# Patient Record
Sex: Male | Born: 2005 | Race: Black or African American | Hispanic: No | Marital: Single | Smoking: Never smoker
Health system: Southern US, Community
[De-identification: ages and names within clinical notes are randomized; demographics above are authoritative.]

## PROBLEM LIST (undated history)

## (undated) DIAGNOSIS — R6250 Unspecified lack of expected normal physiological development in childhood: Secondary | ICD-10-CM

## (undated) DIAGNOSIS — Z8709 Personal history of other diseases of the respiratory system: Secondary | ICD-10-CM

## (undated) DIAGNOSIS — Z9109 Other allergy status, other than to drugs and biological substances: Secondary | ICD-10-CM

## (undated) DIAGNOSIS — H4900 Third [oculomotor] nerve palsy, unspecified eye: Secondary | ICD-10-CM

## (undated) DIAGNOSIS — R29891 Ocular torticollis: Secondary | ICD-10-CM

## (undated) DIAGNOSIS — Z8701 Personal history of pneumonia (recurrent): Secondary | ICD-10-CM

## (undated) DIAGNOSIS — H5021 Vertical strabismus, right eye: Secondary | ICD-10-CM

## (undated) DIAGNOSIS — H50612 Brown's sheath syndrome, left eye: Secondary | ICD-10-CM

## (undated) DIAGNOSIS — H9190 Unspecified hearing loss, unspecified ear: Secondary | ICD-10-CM

## (undated) DIAGNOSIS — Q753 Macrocephaly: Secondary | ICD-10-CM

## (undated) DIAGNOSIS — Z9229 Personal history of other drug therapy: Secondary | ICD-10-CM

## (undated) HISTORY — PX: OTHER SURGICAL HISTORY: SHX169

---
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
## 2023-02-28 ENCOUNTER — Ambulatory Visit (HOSPITAL_COMMUNITY)
Admission: EM | Admit: 2023-02-28 | Discharge: 2023-03-02 | Disposition: A | Discharge status: Home / Self Care | Payer: Medicaid Other | Attending: Psychiatry | Admitting: Psychiatry

## 2023-02-28 DIAGNOSIS — R45851 Suicidal ideations: Secondary | ICD-10-CM | POA: Insufficient documentation

## 2023-02-28 DIAGNOSIS — R4589 Other symptoms and signs involving emotional state: Secondary | ICD-10-CM

## 2023-02-28 DIAGNOSIS — F329 Major depressive disorder, single episode, unspecified: Secondary | ICD-10-CM | POA: Insufficient documentation

## 2023-02-28 DIAGNOSIS — F411 Generalized anxiety disorder: Secondary | ICD-10-CM | POA: Insufficient documentation

## 2023-02-28 DIAGNOSIS — Z1152 Encounter for screening for COVID-19: Secondary | ICD-10-CM | POA: Insufficient documentation

## 2023-02-28 MED ORDER — OLANZAPINE 5 MG PO TBDP: 5.0000 mg | ORAL_TABLET | Freq: Three times a day (TID) | ORAL | Status: DC | PRN

## 2023-02-28 MED ORDER — OLANZAPINE 10 MG IM SOLR: 10.0000 mg | Freq: Three times a day (TID) | INTRAMUSCULAR | Status: DC | PRN

## 2023-02-28 MED ORDER — ACETAMINOPHEN 325 MG PO TABS: 650.0000 mg | ORAL_TABLET | Freq: Four times a day (QID) | ORAL | Status: DC | PRN

## 2023-02-28 MED ORDER — OLANZAPINE 10 MG IM SOLR: 5.0000 mg | Freq: Three times a day (TID) | INTRAMUSCULAR | Status: DC | PRN

## 2023-02-28 MED ORDER — MAGNESIUM HYDROXIDE 400 MG/5ML PO SUSP: 30.0000 mL | Freq: Every day | ORAL | Status: DC | PRN

## 2023-02-28 MED ORDER — ALUM & MAG HYDROXIDE-SIMETH 200-200-20 MG/5ML PO SUSP: 30.0000 mL | ORAL | Status: DC | PRN

## 2023-02-28 NOTE — Progress Notes (Addendum)
   02/28/23 1556  [facility] Triage Screening (Walk-ins at [facility] only)  How Did You Hear About [place]? School/University  What Is the Reason for Your Visit/Call Today? [name] presents to [employer] voluntarily via [employer]. Pt states that he was bought here because he made the comment that he was going to kill himself. Pt states that he has been going through it with stress and depression. Pt currently denies SI, HI, AVH and alcohol/drug use. Pt states that he was talking with the counselor at [facility] and said a little to much which is why he is here. Pt states that the counselor said he could come back upon returning to classes.  How Long Has This Been Causing You Problems? > than 6 months  Have You Recently Had Any Thoughts About Hurting Yourself? Yes  How long ago did you have thoughts about hurting yourself? today - no plan  Are You Planning to Commit Suicide/Harm Yourself At This time? No  Have you Recently Had Thoughts About Hurting Someone [name]? No  Are You Planning To Harm Someone At This Time? No  Physical Abuse Denies  Verbal Abuse Denies  Sexual Abuse Denies  Exploitation of patient/patient's resources Denies  Self-Neglect Denies  Are you currently experiencing any auditory, visual or other hallucinations? No  Have You Used Any Alcohol or Drugs in the Past 24 Hours? No  Do you have any current medical co-morbidities that require immediate attention? No  Clinician description of patient physical appearance/behavior: neatly dressed, poor eye contact (looking at the floor), well-mannered  What Do You Feel Would Help You the Most Today? Treatment for Depression or other mood problem;Medication(s)  If access to [facility] was not available, would you have sought care in the Emergency Department? No  Determination of Need Routine (7 days)  Options For Referral Medication Management;Outpatient Therapy;Intensive Outpatient Therapy

## 2023-02-28 NOTE — ED Notes (Signed)
Patient observed/assessed in bed/chair resting quietly appearing in no distress and verbalizing no complaints at this time. Will continue to monitor.

## 2023-02-28 NOTE — BH Assessment (Signed)
Comprehensive Clinical Assessment (CCA) Note  02/28/2023 [name] [id number]  Disposition: Kenneth Guadeloupe, NP recommends pt to be admitted to [facility] for observation.   The patient demonstrates the following risk factors for suicide: Chronic risk factors for suicide include: Major Depressive Disorder, recurrent, severe without psychotic features and Generalized Anxiety Disorder. Acute risk factors for suicide include:  Per triage note, pt commented that he was going to kill himself . Protective factors for this patient include:  None . Considering these factors, the overall suicide risk at this point appears to be low. Patient is not appropriate for outpatient follow up.  [name] is a 18 year old male who presents voluntary and unaccompanied to [facility] ([facility]). Clinician asked the pt, "what brought you to the hospital?" Pt reports, he's been suicidal for the past 2-3 days with no plan. Pt reports, anxiety and depression are triggers of his suicidal ideations. Pt reports, he has panic attacks three times per week due to being in public alone. Pt denies previous suicide attempts. Pt denies, HI, hallucinations, self-injurious behaviors and access to weapons.   Pt denies, substance use Pt's BAL and UDS are pending. Pt denies, currently being linked to OPT resources (medication management and/or counseling.) Pt denies, previous inpatient admissions.   Pt was a poor historian during the assessment who presents quiet, awake laying on a bench in the assessment room with normal speech and eye contact. Pt's mood was anxious, depressed. Pt's affect was depressed, flat. Pt's insight, judgement are poor.  *Pt reports, he does not want his mother to know he's at the [facility].*  Chief Complaint:  Chief Complaint  Patient presents with   Stress   Depression   Visit Diagnosis: Major Depressive Disorder, recurrent, severe  without psychotic features.                              Generalized Anxiety Disorder.   CCA Screening, Triage and Referral (STR)  Patient Reported Information How did you hear about [place]? School/University  What Is the Reason for Your Visit/Call Today? [name] presents to [employer] voluntarily via [employer]. Pt states that he was bought here because he made the comment that he was going to kill himself. Pt states that he has been going through it with stress and depression. Pt currently denies SI, HI, AVH and alcohol/drug use. Pt states that he was talking with the counselor at [facility] and said a little to much which is why he is here. Pt states that the counselor said he could come back upon returning to classes.  How Long Has This Been Causing You Problems? > than 6 months  What Do You Feel Would Help You the Most Today? Treatment for Depression or other mood problem; Medication(s)   Have You Recently Had Any Thoughts About Hurting Yourself? Yes  Are You Planning to Commit Suicide/Harm Yourself At This time? No   Flowsheet Row ED from 02/28/2023 in [facility]  C-SSRS RISK CATEGORY Low Risk       Have you Recently Had Thoughts About Hurting Someone [name]? No  Are You Planning to Harm Someone at This Time? No  Explanation: None.   Have You Used Any Alcohol or Drugs in the Past 24 Hours? No  How Long Ago Did You Use Drugs or Alcohol? None.  What Did You Use and How Much? None.  Do You Currently Have a Therapist/Psychiatrist? No  Name of Therapist/Psychiatrist:    Have You Been Recently Discharged From Any Office Practice or Programs? No  Explanation of Discharge From Practice/Program: None.     CCA Screening Triage Referral Assessment Type of Contact: Face-to-Face  Telemedicine Service Delivery:   Is this Initial or Reassessment?   Date Telepsych consult ordered in [facility]:    Time Telepsych consult ordered in [facility]:    Location of  Assessment: [facility] Services  Provider Location: [facility] Assessment Services   Collateral Involvement: None.   Does Patient Have a Automotive engineer Guardian? No  Legal Guardian Contact Information: Pt is his own guardian.  Copy of Legal Guardianship Form: -- (Pt is his own guardian.)  Legal Guardian Notified of Arrival: -- (Pt is his own guardian.)  Legal Guardian Notified of Pending Discharge: -- (Pt is his own guardian.)  If Minor and Not Living with Parent(s), Who has Custody? Pt is an adult.  Is CPS involved or ever been involved? Never  Is APS involved or ever been involved? Never   Patient Determined To Be At Risk for Harm To Self or Others Based on Review of Patient Reported Information or Presenting Complaint? Yes, for Self-Harm  Method: No Plan  Availability of Means: No access or NA  Intent: Vague intent or NA  Notification Required: No need or identified person  Additional Information for Danger to Others Potential: -- (None.)  Additional Comments for Danger to Others Potential: None.  Are There Guns or Other Weapons in Your Home? No  Types of Guns/Weapons: Pt denies, access to weapons.  Are These Weapons Safely Secured?                            -- (NA)  Who Could Verify You Are Able To Have These Secured: Pt denies, access to weapons.  Do You Have any Outstanding Charges, Pending Court Dates, Parole/Probation? Pt denies, legal involvement.  Contacted To Inform of Risk of Harm To Self or Others: Other: Comment (None.)    Does Patient Present under Involuntary Commitment? No    [place] of Residence: [name]   Patient Currently Receiving the Following Services: Not Receiving Services   Determination of Need: Urgent (48 hours)   Options For Referral: Medication Management; Outpatient Therapy; Intensive Outpatient Therapy     CCA Biopsychosocial Patient Reported Schizophrenia/Schizoaffective Diagnosis in Past:  No   Strengths: Pt is in school.   Mental Health Symptoms Depression:  Difficulty Concentrating; Tearfulness; Fatigue; Hopelessness; Worthlessness; Irritability; Sleep (too much or little); Increase/decrease in appetite (Isolation.)   Duration of Depressive symptoms: Duration of Depressive Symptoms: Greater than two weeks   Mania:  None   Anxiety:   Difficulty concentrating; Fatigue; Irritability; Restlessness; Sleep; Worrying   Psychosis:  None   Duration of Psychotic symptoms:    Trauma:  None   Obsessions:  None   Compulsions:  None   Inattention:  Disorganized; Forgetful; Loses things; Poor follow-through on tasks; Does not follow instructions (not oppositional)   Hyperactivity/Impulsivity:  Fidgets with hands/feet   Oppositional/Defiant Behaviors:  Angry; None   Emotional Irregularity:  Recurrent suicidal behaviors/gestures/threats   Other Mood/Personality Symptoms:  None.    Mental Status Exam Appearance and self-care  Stature:  Average   Weight:  Average weight   Clothing:  Casual   Grooming:  Normal   Cosmetic use:  None   Posture/gait:  Normal  Motor activity:  Not Remarkable   Sensorium  Attention:  Normal   Concentration:  Normal   Orientation:  X5   Recall/memory:  Normal   Affect and Mood  Affect:  Depressed; Flat   Mood:  Anxious; Depressed   Relating  Eye contact:  Normal   Facial expression:  Depressed   Attitude toward examiner:  Guarded   Thought and Language  Speech flow: Normal   Thought content:  Appropriate to Mood and Circumstances   Preoccupation:  None   Hallucinations:  None   Organization:  Coherent   [employer] of Knowledge:  Poor   Intelligence:  Average   Abstraction:  Functional   Judgement:  Poor   Reality Testing:  Adequate   Insight:  Fair   Decision Making:  Impulsive   Social Functioning  Social Maturity:  Isolates   Social Judgement:  Heedless   Stress   Stressors:  Other (Comment) (Anxiety, depression.)   Coping Ability:  Overwhelmed   Skill Deficits:  Communication   Supports:  Family     Religion: Religion/Spirituality Are You A Religious Person?: No How Might This Affect Treatment?: None.  Leisure/Recreation: Leisure / Recreation Do You Have Hobbies?: No  Exercise/Diet: Exercise/Diet Do You Exercise?: No Have You Gained or Lost A Significant Amount of Weight in the Past Six Months?: No Do You Follow a Special Diet?: No Do You Have Any Trouble Sleeping?: Yes Explanation of Sleeping Difficulties: Pt reports, he doesn't eat a lot, he doesn't feel like eating.   CCA Employment/Education Employment/Work Situation: Employment / Work Situation Employment Situation: Surveyor, minerals Job has Been Impacted by Current Illness: No Has Patient ever Been in the [employer]?: No  Education: Education Is Patient Currently Attending School?: Yes School Currently Attending: Pt attends Nutritional therapist. Last Grade Completed: 12 Did You Attend College?: Yes What Type of College Degree Do you Have?: [employer], getting an Associates in Art. Did You Have An Individualized Education Program (IIEP): No Did You Have Any Difficulty At School?:  (Focusing.) Patient's Education Has Been Impacted by Current Illness: No   CCA Family/Childhood History Family and Relationship History: Family history Marital status: Single Does patient have children?: No  Childhood History:  Childhood History By whom was/is the patient raised?: Mother, Grandparents Did patient suffer any verbal/emotional/physical/sexual abuse as a child?: No Did patient suffer from severe childhood neglect?: No Has patient ever been sexually abused/assaulted/raped as an adolescent or adult?: No Was the patient ever a victim of a crime or a disaster?: No Witnessed domestic violence?: No Has patient been affected by domestic violence as an adult?: No   CCA Substance  Use Alcohol/Drug Use: Alcohol / Drug Use Pain Medications: See MAR Prescriptions: See MAR Over the Counter: See MAR History of alcohol / drug use?: No history of alcohol / drug abuse Longest period of sobriety (when/how long): None. Negative Consequences of Use:  (None.) Withdrawal Symptoms: None    ASAM's:  Six Dimensions of Multidimensional Assessment  Dimension 1:  Acute Intoxication and/or Withdrawal Potential:      Dimension 2:  Biomedical Conditions and Complications:      Dimension 3:  Emotional, Behavioral, or Cognitive Conditions and Complications:     Dimension 4:  Readiness to Change:     Dimension 5:  Relapse, Continued use, or Continued Problem Potential:     Dimension 6:  Recovery/Living Environment:     ASAM Severity Score:    ASAM Recommended Level of Treatment:  Substance use Disorder (SUD)    Recommendations for Services/Supports/Treatments: Recommendations for Services/Supports/Treatments Recommendations For Services/Supports/Treatments: Other (Comment) (Pt to be admitted to [facility] for observation.)  Disposition Recommendation per psychiatric provider: Pt to be admitted to [facility] Care for observation.    DSM5 Diagnoses: Patient Active Problem List   Diagnosis Date Noted   Strain of flexor muscle of left hip 10/09/2018     Referrals to Alternative Service(s): Referred to Alternative Service(s):   Place:   Date:   Time:    Referred to Alternative Service(s):   Place:   Date:   Time:    Referred to Alternative Service(s):   Place:   Date:   Time:    Referred to Alternative Service(s):   Place:   Date:   Time:     Redmond Pulling, [facility] Comprehensive Clinical Assessment (CCA) Screening, Triage and Referral Note  02/28/2023 Gloria Lambertson [id number]  Chief Complaint:  Chief Complaint  Patient presents with   Stress   Depression   Visit Diagnosis:   Patient Reported  Information How did you hear about [place]? School/University  What Is the Reason for Your Visit/Call Today? Duquan Gillooly presents to [employer] voluntarily via [employer]. Pt states that he was bought here because he made the comment that he was going to kill himself. Pt states that he has been going through it with stress and depression. Pt currently denies SI, HI, AVH and alcohol/drug use. Pt states that he was talking with the counselor at [facility] and said a little to much which is why he is here. Pt states that the counselor said he could come back upon returning to classes.  How Long Has This Been Causing You Problems? > than 6 months  What Do You Feel Would Help You the Most Today? Treatment for Depression or other mood problem; Medication(s)   Have You Recently Had Any Thoughts About Hurting Yourself? Yes  Are You Planning to Commit Suicide/Harm Yourself At This time? No   Have you Recently Had Thoughts About Hurting Someone [name]? No  Are You Planning to Harm Someone at This Time? No  Explanation: None.   Have You Used Any Alcohol or Drugs in the Past 24 Hours? No  How Long Ago Did You Use Drugs or Alcohol? None.  What Did You Use and How Much? None.   Do You Currently Have a Therapist/Psychiatrist? No  Name of Therapist/Psychiatrist: None.   Have You Been Recently Discharged From Any Office Practice or Programs? No  Explanation of Discharge From Practice/Program: None.    CCA Screening Triage Referral Assessment Type of Contact: Face-to-Face  Telemedicine Service Delivery:   Is this Initial or Reassessment?   Date Telepsych consult ordered in [facility]:    Time Telepsych consult ordered in [facility]:    Location of Assessment: [facility]  Provider Location: [facility] Services    Collateral Involvement: None.   Does Patient Have a Automotive engineer Guardian? No. Name and Contact of Legal Guardian: Pt is his own guardian.  If Minor and Not Living with  Parent(s), Who has Custody? Pt is an adult.  Is CPS involved or ever been involved? Never  Is APS involved or ever been involved? Never   Patient Determined To Be At Risk for Harm To Self or Others Based on Review of Patient Reported Information or Presenting Complaint? Yes, for Self-Harm  Method: No Plan  Availability of Means:  No access or NA  Intent: Vague intent or NA  Notification Required: No need or identified person  Additional Information for Danger to Others Potential: -- (None.)  Additional Comments for Danger to Others Potential: None.  Are There Guns or Other Weapons in Your Home? No  Types of Guns/Weapons: Pt denies, access to weapons.  Are These Weapons Safely Secured?                            -- (NA)  Who Could Verify You Are Able To Have These Secured: Pt denies, access to weapons.  Do You Have any Outstanding Charges, Pending Court Dates, Parole/Probation? Pt denies, legal involvement.  Contacted To Inform of Risk of Harm To Self or Others: Other: Comment (None.)   Does Patient Present under Involuntary Commitment? No    [place] of Residence: [name]   Patient Currently Receiving the Following Services: Not Receiving Services   Determination of Need: Urgent (48 hours)   Options For Referral: Medication Management; Outpatient Therapy; Intensive Outpatient Therapy   Disposition Recommendation per psychiatric provider: Pt to be admitted to [facility] for observation.   Redmond Pulling, [facility]   Redmond Pulling, MS, [facility], [facility] Triage Specialist [phone]

## 2023-02-28 NOTE — ED Provider Notes (Signed)
 [facility] Continuous Assessment Admission H&P  Date: 03/01/23 Patient Name: [name] MRN: [id number] Chief Complaint: increase anxiety,  and suicidal thoughts   Diagnoses:  Final diagnoses:  Suicidal thoughts  Anxiety state  Difficulty coping    HPI: [name], 18 y/o male with a ADHD, increased anxiety.  Presented to [facility] voluntarily.  Patient was referred by his counselor at [facility], according to the patient he was having a conversation with his counselor and he had told the counselor that he was suicidal, according to the patient he was going to kill himself 2 weeks ago when he was passing by the bridge he had planned to jump but then he kept walking.  Patient stated that he has very bad anxiety and stress.  According to the patient he lives at home with his mom and 2 sisters.  Patient is currently in college.  Patient stated that he was taking medicine when he was in the ninth grade for ADHD but since then he has not taken any more.  Patient reports he is currently not seeing a psychiatrist or therapist at this time. Patient denies access to guns at this time.  A face-to-face evaluation of patient, patient is alert and oriented x 4, speech is clear, maintain eye contact.  Patient does appear to be very anxious and depressed.  Patient affect is flat congruent with mood.  Patient however answer questions appropriately.  Patient stated he had suicidal thoughts but stated he did not have been currently, denies HI, denies AVH or paranoia.  Patient denies alcohol use denies illicit drug use at this time.  Patient denies access to guns denies wanting to hurt himself currently.  Writer discussed with patient that given his circumstances and prior thoughts of wanting to hurt himself we are recommending overnight observation and then reassessment.  Patient in agreement with plan.  Patient was given the opportunity to ask questions which were answered.  Patient also voiced concern that he did  not want his mom to find out.  Recommend observation.  Total Time spent with patient: 20 minutes  Musculoskeletal  Strength & Muscle Tone: within normal limits Gait & Station: normal Patient leans: N/A  Psychiatric Specialty Exam  Presentation General Appearance:  Casual  Eye Contact: Good  Speech: Clear and Coherent  Speech Volume: Normal  Handedness: Right   Mood and Affect  Mood: Anxious  Affect: Appropriate   Thought Process  Thought Processes: Coherent  Descriptions of Associations:Intact  Orientation:Full (Time, Place and Person)  Thought Content:Logical    Hallucinations:Hallucinations: None  Ideas of Reference:None  Suicidal Thoughts:Suicidal Thoughts: Yes, Passive SI Passive Intent and/or Plan: Without Plan; With Intent  Homicidal Thoughts:Homicidal Thoughts: No   Sensorium  Memory: Immediate Fair  Judgment: Fair  Insight: Fair   Executive Functions  Concentration: Good  Attention Span: Good  Recall: Good  Fund of Knowledge: Good  Language: Good   Psychomotor Activity  Psychomotor Activity: Psychomotor Activity: Normal   Assets  Assets: Desire for Improvement; Resilience   Sleep  Sleep: Sleep: Fair Number of Hours of Sleep: 6   Nutritional Assessment (For OBS and FBC admissions only) Has the patient had a weight loss or gain of 10 pounds or more in the last 3 months?: No Has the patient had a decrease in food intake/or appetite?: No Does the patient have dental problems?: No Does the patient have eating habits or behaviors that may be indicators of an eating disorder including binging or inducing vomiting?: No Has  the patient recently lost weight without trying?: 0 Has the patient been eating poorly because of a decreased appetite?: 0 Malnutrition Screening Tool Score: 0    Physical Exam HENT:     Head: Normocephalic.     Nose: Nose normal.  Eyes:     Pupils: Pupils are equal, round, and  reactive to light.  Cardiovascular:     Rate and Rhythm: Normal rate.  Pulmonary:     Effort: Pulmonary effort is normal.  Musculoskeletal:        General: Normal range of motion.     Cervical back: Normal range of motion.  Neurological:     General: No focal deficit present.     Mental Status: He is alert.  Psychiatric:        Mood and Affect: Mood normal.        Behavior: Behavior normal.        Thought Content: Thought content normal.        Judgment: Judgment normal.    Review of Systems  Constitutional: Negative.   HENT: Negative.    Eyes: Negative.   Respiratory: Negative.    Cardiovascular: Negative.   Gastrointestinal: Negative.   Genitourinary: Negative.   Musculoskeletal: Negative.   Skin: Negative.   Neurological: Negative.   Psychiatric/Behavioral:  Positive for depression and suicidal ideas. The patient is nervous/anxious.     Blood pressure 115/74, pulse 60, temperature 98.7 F (37.1 C), temperature source Oral, resp. rate 16, SpO2 100%. There is no height or weight on file to calculate BMI.  Past Psychiatric History: ADHD, anxiety.  Is the patient at risk to self? Yes  Has the patient been a risk to self in the past 6 months? Yes .    Has the patient been a risk to self within the distant past? No   Is the patient a risk to others? No   Has the patient been a risk to others in the past 6 months? No   Has the patient been a risk to others within the distant past? No   Past Medical History: See chart  Family History: Unknown  Social History: Denies patient denies alcohol use denies illicit drug use  Last Labs:  No visits with results within 6 Month(s) from this visit.  Latest known visit with results is:  Admission on 09/16/2015, Discharged on 09/16/2015  Component Date Value Ref Range Status   Hemoglobin 09/16/2015 11.9  11.0 - 14.6 g/dL Final    Allergies: Perennial rye grass pollen [timothy grass pollen allergen]  Medications:  Facility  Ordered Medications  Medication   acetaminophen (TYLENOL) tablet 650 mg   alum & mag hydroxide-simeth (MAALOX/MYLANTA) 200-200-20 MG/5ML suspension 30 mL   magnesium hydroxide (MILK OF MAGNESIA) suspension 30 mL   OLANZapine zydis (ZYPREXA) disintegrating tablet 5 mg   OLANZapine (ZYPREXA) injection 5 mg   OLANZapine (ZYPREXA) injection 10 mg   PTA Medications  Medication Sig   cetirizine HCl (ZYRTEC) 5 MG/5ML SOLN Take 5 mLs (5 mg total) by mouth daily.      Medical Decision Making  Observation   Meds ordered this encounter  Medications   acetaminophen (TYLENOL) tablet 650 mg   alum & mag hydroxide-simeth (MAALOX/MYLANTA) 200-200-20 MG/5ML suspension 30 mL   magnesium hydroxide (MILK OF MAGNESIA) suspension 30 mL   OLANZapine zydis (ZYPREXA) disintegrating tablet 5 mg   OLANZapine (ZYPREXA) injection 5 mg   OLANZapine (ZYPREXA) injection 10 mg    Lab Orders  SARS Coronavirus 2 by RT PCR (hospital order, performed in [facility] hospital lab) *cepheid single result test* Anterior Nasal Swab         CBC with Differential/Platelet         Comprehensive metabolic panel         Ethanol         TSH         POCT Urine Drug Screen - (I-Screen)      Recommendations  Based on my evaluation the patient does not appear to have an emergency medical condition.  [name], NP 03/01/23  4:06 AM   Isa Rankin, MD 03/21/23 [phone]

## 2023-03-01 NOTE — ED Notes (Signed)
Pt sleeping@this  time breathing even and unlabored will continue to monitor for safety

## 2023-03-01 NOTE — ED Provider Notes (Signed)
Behavioral Health Progress Note  Date and Time: 03/01/2023 5:17 PM Name: [name] MRN:  [id number]  Subjective:  [name] is a 18 y/o male with a ADHD, increased anxiety.  Presented to [facility] voluntarily.  Patient was referred by his counselor at [facility], according to the patient he was having a conversation with his counselor and he had told the counselor that he was suicidal, according to the patient he was going to kill himself 2 weeks ago when he was passing by the bridge he had planned to jump but then he kept walking.  Patient stated that he has very bad anxiety and stress.   Today's assessment notes: Patient is seen and examined on the unit sitting up in a chair.  He is alert, calm, cooperative and oriented to person, place, time, and situation.  Chart reviewed and findings shared with the treatment team and consult with attending psychiatrist.  Speech is clear, coherent, with normal volume and pattern.  Able to maintain good eye contact with this provider.  Objectively not responding to internal or external stimuli.  Denies delusional thinking, paranoia or ideas of reference.  He further denies SI, HI or AVH.  Patient reported anxiety as #2/10, depression of #3/10, with 10 being high severity.  Reports appetite is good and he slept over 8 hours last night.  Patient requesting to be discharged, and refusing to share his mom's phone number with this provider.  Made patient aware that this provider needs collateral from mom especially as he lives with his mother prior to his disposition.  Patient finally provided mom's phone number and attempted to call mom x 3 and unable to reach at this time.  Vital signs reviewed within normal limits.  Addendum: Patient's mother finally reached at 5 PM.  She reports that she is going out of town and we may call patient's godmother Behr Cislo at [phone] to keep pick up patient when he is ready to be discharged.  Final diagnoses:  Suicidal  thoughts  Anxiety state  Difficulty coping   Total Time spent with patient: 45 minutes  Past Psychiatric History: See H&P Past Medical History: See H&P Family History: See H&P Family Psychiatric  History: See H&P Social History: See H&P  Additional Social History:    Pain Medications: See MAR Prescriptions: See MAR Over the Counter: See MAR History of alcohol / drug use?: No history of alcohol / drug abuse Longest period of sobriety (when/how long): None. Negative Consequences of Use:  (None.) Withdrawal Symptoms: None   Sleep: Good  Appetite:  Good  Current Medications:  Current Facility-Administered Medications  Medication Dose Route Frequency Provider Last Rate Last Admin   acetaminophen (TYLENOL) tablet 650 mg  650 mg Oral Q6H PRN [name], NP       alum & mag hydroxide-simeth (MAALOX/MYLANTA) 200-200-20 MG/5ML suspension 30 mL  30 mL Oral Q4H PRN [name], NP       magnesium hydroxide (MILK OF MAGNESIA) suspension 30 mL  30 mL Oral Daily PRN [name], NP       OLANZapine (ZYPREXA) injection 10 mg  10 mg Intramuscular TID PRN [name], NP       OLANZapine (ZYPREXA) injection 5 mg  5 mg Intramuscular TID PRN [name], NP       OLANZapine zydis (ZYPREXA) disintegrating tablet 5 mg  5 mg Oral TID PRN [name], NP       No current outpatient medications on file.   Labs  Lab Results:  No visits with results within 6 Month(s) from this visit.  Latest known visit with results is:  Admission on 09/16/2015, Discharged on 09/16/2015  Component Date Value Ref Range Status   Hemoglobin 09/16/2015 11.9  11.0 - 14.6 g/dL Final   Blood Alcohol level:  No results found for: "ETH"  Metabolic Disorder Labs: No results found for: "HGBA1C", "MPG" No results found for: "PROLACTIN" No results found for: "CHOL", "TRIG", "HDL", "CHOLHDL", "VLDL", "LDLCALC"  Therapeutic Lab Levels: No results found for: "LITHIUM" No results found for: "VALPROATE" No  results found for: "CBMZ"  Physical Findings   Flowsheet Row ED from 02/28/2023 in [facility]  C-SSRS RISK CATEGORY Low Risk      Musculoskeletal  Strength & Muscle Tone: within normal limits Gait & Station: normal Patient leans: N/A  Psychiatric Specialty Exam  Presentation  General Appearance:  Appropriate for Environment; Casual; Fairly Groomed  Eye Contact: Fair  Speech: Clear and Coherent  Speech Volume: Normal  Handedness: Right  Mood and Affect  Mood: Anxious; Depressed  Affect: Appropriate; Congruent  Thought Process  Thought Processes: Coherent; Linear  Descriptions of Associations:Intact  Orientation:Full (Time, Place and Person)  Thought Content:Logical  Diagnosis of Schizophrenia or Schizoaffective disorder in past: No    Hallucinations:Hallucinations: None  Ideas of Reference:None  Suicidal Thoughts:Suicidal Thoughts: No SI Passive Intent and/or Plan: -- (Denies)  Homicidal Thoughts:Homicidal Thoughts: No  Sensorium  Memory: Immediate Fair; Recent Fair  Judgment: Fair  Insight: Fair  Art therapist  Concentration: Fair  Attention Span: Fair  Recall: Fair  Fund of Knowledge: Fair  Language: Good  Psychomotor Activity  Psychomotor Activity: Psychomotor Activity: Normal  Assets  Assets: Communication Skills; Physical Health  Sleep  Sleep: Sleep: Fair Number of Hours of Sleep: 8  Physical Exam  Physical Exam Vitals and nursing note reviewed.  HENT:     Head: Normocephalic.     Nose: Nose normal.     Mouth/Throat:     Mouth: Mucous membranes are moist.     Pharynx: Oropharynx is clear.  Eyes:     Extraocular Movements: Extraocular movements intact.  Cardiovascular:     Rate and Rhythm: Normal rate.     Pulses: Normal pulses.  Pulmonary:     Effort: Pulmonary effort is normal.  Abdominal:     Comments: Deferred   Genitourinary:    Penis: Normal.      Comments:  Deferred Musculoskeletal:        General: Normal range of motion.     Cervical back: Normal range of motion.  Skin:    General: Skin is warm.  Neurological:     General: No focal deficit present.     Mental Status: He is alert and oriented to person, place, and time.  Psychiatric:        Mood and Affect: Mood normal.        Behavior: Behavior normal.        Thought Content: Thought content normal.    Review of Systems  Constitutional:  Negative for chills and fever.  HENT:  Negative for hearing loss and sore throat.   Eyes:  Negative for blurred vision.  Respiratory:  Negative for cough, sputum production, shortness of breath and wheezing.   Cardiovascular:  Negative for chest pain and palpitations.  Gastrointestinal:  Negative for abdominal pain, constipation, diarrhea, heartburn, nausea and vomiting.  Genitourinary:  Negative for dysuria, frequency and urgency.  Musculoskeletal: Negative.   Skin:  Negative for itching and rash.  Neurological:  Negative for dizziness, tingling and headaches.  Endo/Heme/Allergies:        See allergy listing  Psychiatric/Behavioral:  Positive for depression. Negative for hallucinations, substance abuse and suicidal ideas. The patient is nervous/anxious. The patient does not have insomnia.    Blood pressure 112/88, pulse 93, temperature 98.3 F (36.8 C), temperature source Oral, resp. rate 20, SpO2 100%. There is no height or weight on file to calculate BMI.  Treatment Plan Summary: Daily contact with patient to assess and evaluate symptoms and progress in treatment and Medication management  Cecilie Lowers, FNP 03/01/2023 5:17 PM

## 2023-03-01 NOTE — ED Notes (Signed)
Patient A&O x 3 calm and cooperative with a logical thought process and clear coherent speech. Patient denies SI, HI, AVH. Patient does not appear to be responsive to internal stimuli. Patient verbally contracted for safety.

## 2023-03-01 NOTE — ED Notes (Signed)
Pt is anxious due to him wanting to leave the facility he states he feels he is not being helped by just laying here it was explained to him that due to him feeling SI with plan to jump off bridge we want to help him he is denying SI/HI/AVH at this time he say he is not depressed anymore and he just wants to go home I will let the NP know of  pt request he denies any pain or distress he is alert and orient x 4 will continue to monitor for safety

## 2023-03-01 NOTE — ED Notes (Signed)
Meeting with the provider

## 2023-03-02 LAB — TSH: TSH: 1.708 u[IU]/mL (ref 0.350–4.500)

## 2023-03-02 LAB — CBC WITH DIFFERENTIAL/PLATELET
Abs Immature Granulocytes: 0 10*3/uL (ref 0.00–0.07)
Basophils Absolute: 0 10*3/uL (ref 0.0–0.1)
Basophils Relative: 1 %
Eosinophils Absolute: 0.1 10*3/uL (ref 0.0–0.5)
Eosinophils Relative: 4 %
HCT: 46.2 % (ref 39.0–52.0)
Hemoglobin: 15.6 g/dL (ref 13.0–17.0)
Immature Granulocytes: 0 %
Lymphocytes Relative: 42 %
Lymphs Abs: 1.5 10*3/uL (ref 0.7–4.0)
MCH: 29.1 pg (ref 26.0–34.0)
MCHC: 33.8 g/dL (ref 30.0–36.0)
MCV: 86 fL (ref 80.0–100.0)
Monocytes Absolute: 0.3 10*3/uL (ref 0.1–1.0)
Monocytes Relative: 9 %
Neutro Abs: 1.6 10*3/uL — ABNORMAL LOW (ref 1.7–7.7)
Neutrophils Relative %: 44 %
Platelets: 285 10*3/uL (ref 150–400)
RBC: 5.37 MIL/uL (ref 4.22–5.81)
RDW: 12.3 % (ref 11.5–15.5)
WBC: 3.6 10*3/uL — ABNORMAL LOW (ref 4.0–10.5)
nRBC: 0 % (ref 0.0–0.2)

## 2023-03-02 LAB — POCT URINE DRUG SCREEN - MANUAL ENTRY (I-SCREEN)
POC Amphetamine UR: NOT DETECTED
POC Buprenorphine (BUP): NOT DETECTED
POC Cocaine UR: NOT DETECTED
POC Marijuana UR: NOT DETECTED
POC Methadone UR: NOT DETECTED
POC Methamphetamine UR: NOT DETECTED
POC Morphine: NOT DETECTED
POC Oxazepam (BZO): NOT DETECTED
POC Oxycodone UR: NOT DETECTED
POC Secobarbital (BAR): NOT DETECTED

## 2023-03-02 LAB — COMPREHENSIVE METABOLIC PANEL
ALT: 11 U/L (ref 0–44)
AST: 19 U/L (ref 15–41)
Albumin: 4.5 g/dL (ref 3.5–5.0)
Alkaline Phosphatase: 99 U/L (ref 38–126)
Anion gap: 9 (ref 5–15)
BUN: 18 mg/dL (ref 6–20)
CO2: 26 mmol/L (ref 22–32)
Calcium: 9.8 mg/dL (ref 8.9–10.3)
Chloride: 105 mmol/L (ref 98–111)
Creatinine, Ser: 0.96 mg/dL (ref 0.61–1.24)
GFR, Estimated: >60 mL/min (ref >60)
Glucose, Bld: 80 mg/dL (ref 70–99)
Potassium: 4.2 mmol/L (ref 3.5–5.1)
Sodium: 140 mmol/L (ref 135–145)
Total Bilirubin: 2.1 mg/dL — ABNORMAL HIGH (ref 0.0–1.2)
Total Protein: 7.4 g/dL (ref 6.5–8.1)

## 2023-03-02 LAB — SARS CORONAVIRUS 2 BY RT PCR: SARS Coronavirus 2 by RT PCR: NEGATIVE

## 2023-03-02 LAB — ETHANOL: Alcohol, Ethyl (B): <10 mg/dL (ref <10)

## 2023-03-02 MED ORDER — FLUOXETINE HCL 10 MG PO CAPS: 10.0000 mg | ORAL_CAPSULE | Freq: Every day | ORAL | 3 refills | Status: DC

## 2023-03-02 MED ORDER — HYDROXYZINE HCL 25 MG PO TABS: 25.0000 mg | ORAL_TABLET | Freq: Three times a day (TID) | ORAL | 0 refills | Status: DC | PRN

## 2023-03-02 MED ORDER — HYDROXYZINE HCL 25 MG PO TABS: 25.0000 mg | ORAL_TABLET | Freq: Three times a day (TID) | ORAL | Status: DC | PRN

## 2023-03-02 MED ORDER — FLUOXETINE HCL 10 MG PO CAPS
10.0000 mg | ORAL_CAPSULE | Freq: Every day | ORAL | Status: DC
  Administered 2023-03-02: 10 mg via ORAL
  Filled 2023-03-02: qty 1

## 2023-03-02 NOTE — ED Notes (Signed)
Patient alert & oriented x4. Denies intent to harm self or others when asked. Denies A/VH. Patient denies any physical complaints when asked. Patient did get teary (1 single tear after which he covered majority of his face) during assessment. Patient in no visible acute distress at this time. Support and encouragement provided. Routine safety checks conducted per facility protocol. Encouraged patient to notify staff if any thoughts of harm towards self or others arise. Patient verbalizes understanding and agreement.

## 2023-03-02 NOTE — ED Notes (Signed)
Patient resting in lounger with eyes closed, respirations even and unlabored. Patient in no apparent acute distress. Environment secured. Safety checks in place per facility protocol.

## 2023-03-02 NOTE — ED Notes (Signed)
Patient resting quietly in bed with eyes closed. Respirations equal and unlabored, skin warm and dry, NAD. Routine safety checks conducted according to facility protocol. Will continue to monitor for safety.

## 2023-03-02 NOTE — ED Provider Notes (Signed)
FBC/OBS ASAP Discharge Summary  Date and Time: 03/02/2023 12:48 PM  Name: [name]  MRN:  [id number]   Discharge Diagnoses:  Final diagnoses:  Suicidal thoughts  Anxiety state  Difficulty coping  Major depressive disorder with current active episode, unspecified depression episode severity, unspecified whether recurrent   Subjective: [name] is a 18 y/o male with a ADHD, increased anxiety.  Presented to [facility] voluntarily.  Patient was referred by his counselor at [facility], according to the patient he was having a conversation with his counselor and he had told the counselor that he was suicidal, according to the patient he was going to kill himself 2 weeks ago when he was passing by the bridge he had planned to jump but then he kept walking.  Patient stated that he has very bad anxiety and stress.   Stay Summary:  Discharge Recommendations:  The patient is being discharged with his family. Patient is to take his discharge medications as ordered.  See follow up above. We recommend that he participate in individual therapy to target uncontrollable agitation and substance abuse.  We recommend that he participate in family therapy to target the conflict with his family, to improve communication skills and conflict resolution skills.  Family is to initiate/implement a contingency based behavioral model to address patient's behavior. We recommend that he get AIMS scale, height, weight, blood pressure, fasting lipid panel, fasting blood sugar in three months from discharge as he's on atypical antipsychotics.  Patient will benefit from monitoring of recurrent suicidal ideation since patient is on antidepressant medication. The patient should abstain from all illicit substances and alcohol.  If the patient's symptoms worsen or do not continue to improve or if the patient becomes actively suicidal or homicidal then it is recommended that the patient return to the closest hospital emergency room  or call 911 for further evaluation and treatment. National Suicide Prevention Lifeline 1800-SUICIDE or [phone]. Please follow up with your primary medical doctor for all other medical needs.  The patient has been educated on the possible side effects to medications and he/his guardian is to contact a medical professional and inform outpatient provider of any new side effects of medication. He s to take regular diet and activity as tolerated.  Will benefit from moderate daily exercise. Family was educated about removing/locking any firearms, medications or dangerous products from the home.  Total Time spent with patient: 45 minutes  Past Psychiatric History: Past history of ADHD, increased anxiety. Past Medical History: See H&P Family History: See H&P Family Psychiatric History: See HPI Social History: See H&P Tobacco Cessation:  N/A, patient does not currently use tobacco products  Current Medications:  Current Facility-Administered Medications  Medication Dose Route Frequency Provider Last Rate Last Admin   acetaminophen (TYLENOL) tablet 650 mg  650 mg Oral Q6H PRN [name], NP       alum & mag hydroxide-simeth (MAALOX/MYLANTA) 200-200-20 MG/5ML suspension 30 mL  30 mL Oral Q4H PRN [name], NP       magnesium hydroxide (MILK OF MAGNESIA) suspension 30 mL  30 mL Oral Daily PRN [name], NP       OLANZapine (ZYPREXA) injection 10 mg  10 mg Intramuscular TID PRN [name], NP       OLANZapine (ZYPREXA) injection 5 mg  5 mg Intramuscular TID PRN [name], NP       OLANZapine zydis (ZYPREXA) disintegrating tablet 5 mg  5 mg Oral TID PRN [name], NP  No current outpatient medications on file.   PTA Medications:  Facility Ordered Medications  Medication   acetaminophen (TYLENOL) tablet 650 mg   alum & mag hydroxide-simeth (MAALOX/MYLANTA) 200-200-20 MG/5ML suspension 30 mL   magnesium hydroxide (MILK OF MAGNESIA) suspension 30 mL   OLANZapine zydis  (ZYPREXA) disintegrating tablet 5 mg   OLANZapine (ZYPREXA) injection 5 mg   OLANZapine (ZYPREXA) injection 10 mg       No data to display         Flowsheet Row ED from [facility]/28/2023 [facility]  C-SSRS RISK CATEGORY Low Risk      Musculoskeletal  Strength & Muscle Tone: within normal limits Gait & Station: normal Patient leans: N/A  Psychiatric Specialty Exam  Presentation  General Appearance:  Appropriate for Environment; Casual  Eye Contact: Good  Speech: Clear and Coherent; Normal Rate  Speech Volume: Normal  Handedness: Right  Mood and Affect  Mood: Euthymic  Affect: Congruent  Thought Process  Thought Processes: Coherent; Linear  Descriptions of Associations:Intact  Orientation:Full (Time, Place and Person)  Thought Content:Logical  Diagnosis of Schizophrenia or Schizoaffective disorder in past: No    Hallucinations:Hallucinations: None  Ideas of Reference:None  Suicidal Thoughts:Suicidal Thoughts: No SI Passive Intent and/or Plan: -- (Denies)  Homicidal Thoughts:Homicidal Thoughts: No  Sensorium  Memory: Immediate Good; Recent Good  Judgment: Fair  Insight: Fair  Art therapist  Concentration: Good  Attention Span: Good  Recall: Good  Fund of Knowledge: Fair  Language: Good  Psychomotor Activity  Psychomotor Activity: Psychomotor Activity: Normal  Assets  Assets: Communication Skills; Desire for Improvement; Housing; Social Support  Sleep  Sleep: Sleep: Good Number of Hours of Sleep: 10   Nutritional Assessment (For OBS and FBC admissions only) Has the patient had a weight loss or gain of 10 pounds or more in the last 3 months?: No Has the patient had a decrease in food intake/or appetite?: No Does the patient have dental problems?: No Does the patient have eating habits or behaviors that may be indicators of an eating disorder including binging or inducing vomiting?:  No Has the patient recently lost weight without trying?: 0 Has the patient been eating poorly because of a decreased appetite?: 0 Malnutrition Screening Tool Score: 0    Physical Exam  Physical Exam Vitals and nursing note reviewed.  HENT:     Head: Normocephalic.     Nose: Nose normal.     Mouth/Throat:     Mouth: Mucous membranes are moist.     Pharynx: Oropharynx is clear.  Eyes:     Extraocular Movements: Extraocular movements intact.  Cardiovascular:     Rate and Rhythm: Normal rate.     Pulses: Normal pulses.  Pulmonary:     Effort: Pulmonary effort is normal.  Abdominal:     Comments: Deferred  Genitourinary:    Comments: Deferred Musculoskeletal:        General: Normal range of motion.     Cervical back: Normal range of motion.  Skin:    General: Skin is warm.  Neurological:     General: No focal deficit present.     Mental Status: He is alert and oriented to person, place, and time.  Psychiatric:        Mood and Affect: Mood normal.        Behavior: Behavior normal.        Thought Content: Thought content normal.    Review of Systems  Constitutional:  Negative for  chills and fever.  HENT:  Negative for sore throat.   Eyes:  Negative for blurred vision.  Respiratory:  Negative for cough, sputum production, shortness of breath and wheezing.   Cardiovascular:  Negative for chest pain and palpitations.  Gastrointestinal:  Negative for abdominal pain, constipation, diarrhea, heartburn, nausea and vomiting.  Genitourinary: Negative.   Musculoskeletal: Negative.   Skin:  Negative for itching and rash.  Neurological:  Negative for dizziness, tingling and headaches.  Endo/Heme/Allergies:        See allergy listing  Psychiatric/Behavioral:  Negative for depression, hallucinations, substance abuse and suicidal ideas. The patient is nervous/anxious.    Blood pressure 122/77, pulse 72, temperature 97.8 F (36.6 C), temperature source Oral, resp. rate 16, SpO2 99%.  There is no height or weight on file to calculate BMI.  Demographic Factors:  Male, Adolescent or young adult, Low socioeconomic status, and Unemployed  Loss Factors: NA  Historical Factors: NA  Risk Reduction Factors:   NA  Continued Clinical Symptoms:  Depression:   Recent sense of peace/wellbeing  Cognitive Features That Contribute To Risk:  Polarized thinking    Suicide Risk:  Mild:  Suicidal ideation of limited frequency, intensity, duration, and specificity.  There are no identifiable plans, no associated intent, mild dysphoria and related symptoms, good self-control (both objective and subjective assessment), few other risk factors, and identifiable protective factors, including available and accessible social support.  Plan Of Care/Follow-up recommendations:   Activity: as tolerated  Diet: heart healthy  Other: -Follow-up with your outpatient psychiatric provider -instructions on appointment date, time, and address (location) are provided to you in discharge paperwork.  -Take your psychiatric medications as prescribed at discharge - instructions are provided to you in the discharge paperwork  -Follow-up with outpatient primary care doctor and other specialists -for management of preventative medicine and chronic medical disease: None  -Testing: Follow-up with outpatient provider for abnormal lab results: None  -If you are prescribed an atypical antipsychotic medication, we recommend that your outpatient psychiatrist follow routine screening for side effects within 3 months of discharge, including monitoring: AIMS scale, height, weight, blood pressure, fasting lipid panel, HbA1c, and fasting blood sugar.   -Recommend total abstinence from alcohol, tobacco, and other illicit drug use at discharge.   -If your psychiatric symptoms recur, worsen, or if you have side effects to your psychiatric medications, call your outpatient psychiatric provider, 911, [phone] or go to the  nearest emergency department.  -If suicidal thoughts occur, immediately call your outpatient psychiatric provider, 911, [phone] or go to the nearest emergency department.  Disposition: Discharged to home  Cecilie Lowers, FNP 03/02/2023, 12:48 PM

## 2023-03-02 NOTE — ED Notes (Signed)
Patient resting in lounger. Calm, collected, no physical complaints at this time. Patient in no apparent acute distress. Environment secured. Safety checks in place per facility protocol.

## 2023-03-02 NOTE — ED Notes (Signed)
Patient denied breakfast

## 2023-03-02 NOTE — ED Notes (Signed)
Pt discharged with  AVS.  AVS reviewed prior to discharge.  Pt alert, oriented, and ambulatory.  Safety maintained.

## 2023-03-07 ENCOUNTER — Encounter (HOSPITAL_COMMUNITY): Payer: Self-pay

## 2023-03-07 ENCOUNTER — Emergency Department (HOSPITAL_COMMUNITY)
Admission: EM | Admit: 2023-03-07 | Discharge: 2023-03-07 | Disposition: A | Discharge status: Home / Self Care | Payer: Medicaid Other | Attending: Emergency Medicine | Admitting: Emergency Medicine

## 2023-03-07 ENCOUNTER — Other Ambulatory Visit: Payer: Self-pay

## 2023-03-07 DIAGNOSIS — F32A Depression, unspecified: Secondary | ICD-10-CM | POA: Diagnosis present

## 2023-03-07 DIAGNOSIS — F419 Anxiety disorder, unspecified: Secondary | ICD-10-CM | POA: Insufficient documentation

## 2023-03-07 LAB — ACETAMINOPHEN LEVEL: Acetaminophen (Tylenol), Serum: <10 ug/mL — ABNORMAL LOW (ref 10–30)

## 2023-03-07 LAB — COMPREHENSIVE METABOLIC PANEL
ALT: 13 U/L (ref 0–44)
AST: 23 U/L (ref 15–41)
Albumin: 4.5 g/dL (ref 3.5–5.0)
Alkaline Phosphatase: 105 U/L (ref 38–126)
Anion gap: 8 (ref 5–15)
BUN: 18 mg/dL (ref 6–20)
CO2: 27 mmol/L (ref 22–32)
Calcium: 9.2 mg/dL (ref 8.9–10.3)
Chloride: 104 mmol/L (ref 98–111)
Creatinine, Ser: 0.85 mg/dL (ref 0.61–1.24)
GFR, Estimated: >60 mL/min (ref >60)
Glucose, Bld: 94 mg/dL (ref 70–99)
Potassium: 3.9 mmol/L (ref 3.5–5.1)
Sodium: 139 mmol/L (ref 135–145)
Total Bilirubin: 1.1 mg/dL (ref 0.0–1.2)
Total Protein: 7.7 g/dL (ref 6.5–8.1)

## 2023-03-07 LAB — CBC
HCT: 42.3 % (ref 39.0–52.0)
Hemoglobin: 13.8 g/dL (ref 13.0–17.0)
MCH: 28.7 pg (ref 26.0–34.0)
MCHC: 32.6 g/dL (ref 30.0–36.0)
MCV: 87.9 fL (ref 80.0–100.0)
Platelets: 322 10*3/uL (ref 150–400)
RBC: 4.81 MIL/uL (ref 4.22–5.81)
RDW: 12 % (ref 11.5–15.5)
WBC: 4.8 10*3/uL (ref 4.0–10.5)
nRBC: 0 % (ref 0.0–0.2)

## 2023-03-07 LAB — ETHANOL: Alcohol, Ethyl (B): <10 mg/dL (ref <10)

## 2023-03-07 LAB — RAPID URINE DRUG SCREEN, HOSP PERFORMED
Amphetamines: NOT DETECTED
Barbiturates: NOT DETECTED
Benzodiazepines: NOT DETECTED
Cocaine: NOT DETECTED
Opiates: NOT DETECTED
Tetrahydrocannabinol: NOT DETECTED

## 2023-03-07 LAB — SALICYLATE LEVEL: Salicylate Lvl: <7 mg/dL — ABNORMAL LOW (ref 7.0–30.0)

## 2023-03-07 MED ORDER — HYDROXYZINE HCL 25 MG PO TABS: 25.0000 mg | ORAL_TABLET | Freq: Four times a day (QID) | ORAL | 0 refills | Status: DC

## 2023-03-07 NOTE — Discharge Instructions (Signed)
Start Vistaril and it looks like you were started on Prozac.  I think that these are reasonable medications to start.  Follow-up with behavioral health.  Call behavioral health number that I provided you and ask for outpatient resources for depression.

## 2023-03-07 NOTE — ED Triage Notes (Signed)
Depression over the last few years that has been worsening this week. Denies SI or HI, but states he feels more depressed than he ever has. No hallucinations, not on medications for depression.

## 2023-03-07 NOTE — ED Provider Notes (Signed)
[place] EMERGENCY DEPARTMENT AT [facility] Provider Note   CSN: [id number] Arrival date & time: 03/07/23  1926     History  Chief Complaint  Patient presents with   Depression    Jaylan Hinojosa is a 18 y.o. male.  Patient here with depression anxiety.  Denies any suicidal homicidal thoughts.  He has been dealing with the symptoms for a long time.  Sounds like he was recently started on some medications possibly Prozac.  He denies any weakness numbness tingling.  No headache chest pain shortness of breath.  No access to weapons at his home.  He lives with his mom.  He feels safe at home.  He has a friend with him that he is been friends with for 7 years.  He does not have access any medications that can cause harm as well.  The history is provided by the patient.       Home Medications Prior to Admission medications   Medication Sig Start Date End Date Taking? Authorizing Provider  hydrOXYzine (ATARAX) 25 MG tablet Take 1 tablet (25 mg total) by mouth every 6 (six) hours. 03/07/23  Yes Raney Koeppen, DO  FLUoxetine (PROZAC) 10 MG capsule Take 1 capsule (10 mg total) by mouth daily. 03/02/23   Cecilie Lowers, FNP      Allergies    Patient has no known allergies.    Review of Systems   Review of Systems  Physical Exam Updated Vital Signs BP (!) 135/56   Pulse 64   Temp 98.5 F (36.9 C)   Resp 16   Ht 5\' 9"  (1.753 m)   Wt 54.4 kg   SpO2 100%   BMI 17.72 kg/m  Physical Exam Vitals and nursing note reviewed.  Constitutional:      General: He is not in acute distress.    Appearance: He is well-developed. He is not ill-appearing.  HENT:     Head: Normocephalic and atraumatic.     Nose: Nose normal.     Mouth/Throat:     Mouth: Mucous membranes are moist.  Eyes:     Extraocular Movements: Extraocular movements intact.     Conjunctiva/sclera: Conjunctivae normal.     Pupils: Pupils are equal, round, and reactive to light.  Cardiovascular:     Rate and  Rhythm: Normal rate and regular rhythm.     Pulses: Normal pulses.     Heart sounds: Normal heart sounds. No murmur heard. Pulmonary:     Effort: Pulmonary effort is normal. No respiratory distress.     Breath sounds: Normal breath sounds.  Abdominal:     Palpations: Abdomen is soft.     Tenderness: There is no abdominal tenderness.  Musculoskeletal:        General: No swelling.     Cervical back: Normal range of motion and neck supple.  Skin:    General: Skin is warm and dry.     Capillary Refill: Capillary refill takes less than 2 seconds.  Neurological:     General: No focal deficit present.     Mental Status: He is alert.  Psychiatric:     Comments: Patient admits to depression but he denies SI HI, he seems very well-adjusted, trustworthy     ED Results / Procedures / Treatments   Labs (all labs ordered are listed, but only abnormal results are displayed) Labs Reviewed  SALICYLATE LEVEL - Abnormal; Notable for the following components:      Result Value  Salicylate Lvl <7.0 (*)    All other components within normal limits  ACETAMINOPHEN LEVEL - Abnormal; Notable for the following components:   Acetaminophen (Tylenol), Serum <10 (*)    All other components within normal limits  COMPREHENSIVE METABOLIC PANEL  ETHANOL  CBC  RAPID URINE DRUG SCREEN, HOSP PERFORMED    EKG None  Radiology No results found.  Procedures Procedures    Medications Ordered in ED Medications - No data to display  ED Course/ Medical Decision Making/ A&P                                 Medical Decision Making Amount and/or Complexity of Data Reviewed Labs: ordered.  Risk Prescription drug management.   Deke Tilghman is here with depression.  He denies SI HI.  He had medical clearance labs ordered before my evaluation which were unremarkable.  Overall I am able to contract for safety with him.  He has no access to weapons at home.  He lives with his mom.  He feels safe at home.   He has a good friend with him that he trusts.  Overall looks like he was started on Prozac but has not had a started yet.  Looks like he was probably prescribed Vistaril as well but we will represcribe this just in case.  Overall he is trustworthy well-appearing we had extensive talk about anxiety depression.  I think he will return if he starts to feel suicidal homicidal but at this time he is just looking for some outpatient support.  I have given him some resources for this.  He was discharged in good condition.  This chart was dictated using voice recognition software.  Despite best efforts to proofread,  errors can occur which can change the documentation meaning.         Final Clinical Impression(s) / ED Diagnoses Final diagnoses:  Depression, unspecified depression type    Rx / DC Orders ED Discharge Orders          Ordered    hydrOXYzine (ATARAX) 25 MG tablet  Every 6 hours        03/07/23 2214              Virgina Norfolk, DO 03/07/23 2217

## 2023-05-03 ENCOUNTER — Inpatient Hospital Stay (HOSPITAL_COMMUNITY): Admission: AD | Admit: 2023-05-03 | Source: Intra-hospital

## 2023-05-03 ENCOUNTER — Ambulatory Visit (HOSPITAL_COMMUNITY)
Admission: EM | Admit: 2023-05-03 | Discharge: 2023-05-04 | Disposition: A | Discharge status: Other Acute Inpatient Hospital | Attending: Psychiatry | Admitting: Psychiatry

## 2023-05-03 DIAGNOSIS — F333 Major depressive disorder, recurrent, severe with psychotic symptoms: Secondary | ICD-10-CM | POA: Insufficient documentation

## 2023-05-03 LAB — HEMOGLOBIN A1C
Hgb A1c MFr Bld: 4.5 % — ABNORMAL LOW (ref 4.8–5.6)
Mean Plasma Glucose: 82.45 mg/dL

## 2023-05-03 LAB — LIPID PANEL
Cholesterol: 119 mg/dL (ref 0–200)
HDL: 51 mg/dL (ref >40)
LDL Cholesterol: 54 mg/dL (ref 0–99)
Total CHOL/HDL Ratio: 2.3 ratio
Triglycerides: 72 mg/dL (ref <150)
VLDL: 14 mg/dL (ref 0–40)

## 2023-05-03 LAB — CBC WITH DIFFERENTIAL/PLATELET
Abs Immature Granulocytes: 0.01 10*3/uL (ref 0.00–0.07)
Basophils Absolute: 0 10*3/uL (ref 0.0–0.1)
Basophils Relative: 1 %
Eosinophils Absolute: 0.2 10*3/uL (ref 0.0–0.5)
Eosinophils Relative: 5 %
HCT: 40.3 % (ref 39.0–52.0)
Hemoglobin: 13.4 g/dL (ref 13.0–17.0)
Immature Granulocytes: 0 %
Lymphocytes Relative: 44 %
Lymphs Abs: 1.9 10*3/uL (ref 0.7–4.0)
MCH: 28.6 pg (ref 26.0–34.0)
MCHC: 33.3 g/dL (ref 30.0–36.0)
MCV: 85.9 fL (ref 80.0–100.0)
Monocytes Absolute: 0.3 10*3/uL (ref 0.1–1.0)
Monocytes Relative: 8 %
Neutro Abs: 1.8 10*3/uL (ref 1.7–7.7)
Neutrophils Relative %: 42 %
Platelets: 298 10*3/uL (ref 150–400)
RBC: 4.69 MIL/uL (ref 4.22–5.81)
RDW: 11.9 % (ref 11.5–15.5)
WBC: 4.2 10*3/uL (ref 4.0–10.5)
nRBC: 0 % (ref 0.0–0.2)

## 2023-05-03 LAB — POCT URINE DRUG SCREEN - MANUAL ENTRY (I-SCREEN)
POC Amphetamine UR: NOT DETECTED
POC Buprenorphine (BUP): NOT DETECTED
POC Cocaine UR: NOT DETECTED
POC Marijuana UR: NOT DETECTED
POC Methadone UR: NOT DETECTED
POC Methamphetamine UR: NOT DETECTED
POC Morphine: NOT DETECTED
POC Oxazepam (BZO): NOT DETECTED
POC Oxycodone UR: NOT DETECTED
POC Secobarbital (BAR): NOT DETECTED

## 2023-05-03 LAB — ETHANOL: Alcohol, Ethyl (B): <10 mg/dL (ref <10)

## 2023-05-03 LAB — COMPREHENSIVE METABOLIC PANEL WITH GFR
ALT: 13 U/L (ref 0–44)
AST: 21 U/L (ref 15–41)
Albumin: 4.4 g/dL (ref 3.5–5.0)
Alkaline Phosphatase: 83 U/L (ref 38–126)
Anion gap: 8 (ref 5–15)
BUN: 12 mg/dL (ref 6–20)
CO2: 28 mmol/L (ref 22–32)
Calcium: 9.7 mg/dL (ref 8.9–10.3)
Chloride: 101 mmol/L (ref 98–111)
Creatinine, Ser: 0.99 mg/dL (ref 0.61–1.24)
GFR, Estimated: >60 mL/min (ref >60)
Glucose, Bld: 125 mg/dL — ABNORMAL HIGH (ref 70–99)
Potassium: 4 mmol/L (ref 3.5–5.1)
Sodium: 137 mmol/L (ref 135–145)
Total Bilirubin: 1.3 mg/dL — ABNORMAL HIGH (ref 0.0–1.2)
Total Protein: 7.1 g/dL (ref 6.5–8.1)

## 2023-05-03 LAB — TSH: TSH: 2.034 u[IU]/mL (ref 0.350–4.500)

## 2023-05-03 MED ORDER — DIPHENHYDRAMINE HCL 50 MG/ML IJ SOLN: 50.0000 mg | Freq: Three times a day (TID) | INTRAMUSCULAR | Status: DC | PRN

## 2023-05-03 MED ORDER — HALOPERIDOL 5 MG PO TABS: 5.0000 mg | ORAL_TABLET | Freq: Three times a day (TID) | ORAL | Status: DC | PRN

## 2023-05-03 MED ORDER — OLANZAPINE 5 MG PO TBDP
5.0000 mg | ORAL_TABLET | Freq: Two times a day (BID) | ORAL | Status: DC
  Administered 2023-05-03: 5 mg via ORAL
  Filled 2023-05-03: qty 1

## 2023-05-03 MED ORDER — LORAZEPAM 2 MG/ML IJ SOLN: 2.0000 mg | Freq: Three times a day (TID) | INTRAMUSCULAR | Status: DC | PRN

## 2023-05-03 MED ORDER — ALUM & MAG HYDROXIDE-SIMETH 200-200-20 MG/5ML PO SUSP: 30.0000 mL | ORAL | Status: DC | PRN

## 2023-05-03 MED ORDER — ACETAMINOPHEN 325 MG PO TABS: 650.0000 mg | ORAL_TABLET | Freq: Four times a day (QID) | ORAL | Status: DC | PRN

## 2023-05-03 MED ORDER — MAGNESIUM HYDROXIDE 400 MG/5ML PO SUSP
30.0000 mL | Freq: Every day | ORAL | Status: DC | PRN
Start: 2023-05-03 — End: 2023-05-04

## 2023-05-03 MED ORDER — HALOPERIDOL LACTATE 5 MG/ML IJ SOLN: 5.0000 mg | Freq: Three times a day (TID) | INTRAMUSCULAR | Status: DC | PRN

## 2023-05-03 MED ORDER — FLUOXETINE HCL 20 MG PO CAPS: 20.0000 mg | ORAL_CAPSULE | Freq: Every day | ORAL | Status: DC

## 2023-05-03 MED ORDER — HALOPERIDOL LACTATE 5 MG/ML IJ SOLN: 10.0000 mg | Freq: Three times a day (TID) | INTRAMUSCULAR | Status: DC | PRN

## 2023-05-03 MED ORDER — TRAZODONE HCL 50 MG PO TABS
50.0000 mg | ORAL_TABLET | Freq: Every evening | ORAL | Status: DC | PRN
  Administered 2023-05-03: 50 mg via ORAL
  Filled 2023-05-03: qty 1

## 2023-05-03 MED ORDER — DIPHENHYDRAMINE HCL 50 MG PO CAPS: 50.0000 mg | ORAL_CAPSULE | Freq: Three times a day (TID) | ORAL | Status: DC | PRN

## 2023-05-03 NOTE — ED Notes (Signed)
 Patient admitted to facility due thoughts of wanting to harm self.  Patient stated he wants to overdose on his psych meds.  Patient cooperative with admission however appears anxious.  Will monitor.

## 2023-05-03 NOTE — ED Provider Notes (Signed)
 Behavioral Health Urgent Care Medical Screening Exam  Patient Name: [name] MRN: [id number] Date of Evaluation: 05/03/23 Chief Complaint:  " I am always depressed" Diagnosis:  Final diagnoses:  Severe recurrent major depression with psychotic features, mood-congruent (HCC)    [name], 18 y.o., male patient seen face to face by this provider, consulted with Dr. Genita Keys; and chart reviewed on 05/03/23.   History of Present illness: [name] is a 18 y.o. male, with history of depression with suicidal thoughts presents today accompanied by [name] ([place]) and counselor from [place]. Patient was attending a weekly counseling session and revealed to his therapist that he had been taking extra doses of his hydroxyzine to facilitate sleep when he was severely depressed.  He reports he was taking his fluoxetine 1 tablet/day as normal.  He has not established with a medication provider and has been taking doses of medication that he was able to get a refill from the emergency department on 03/07/2023.  Patient was admitted here to the crisis center on 02/28/2023 Due to suicidal ideations with a plan of jumping off of a bridge.  During his admission he was started on fluoxetine and hydroxyzine and subsequently discharged with these medications.  Patient was instructed to follow-up here at the [facility] outpatient clinic but reports that he never was able to follow-up. Patient endorses due to worsening depression he was unable to successfully complete his first semester at [employer] and was placed on academic and financial aid probation and is currently in special classes to help him improve his grade point average.  Patient reports since high school he is struggled with schoolwork and denies that he was ever placed in any special classes or given additional time for assignments.  Patient is currently residing with his grandfather following his admission at [name] and reports he moved in with his grandfather in order to  have his own room.  He reports he is alone for several hours throughout the day as his grandfather works full-time in isolation is worsening his symptoms of depression. He denies any other identifiable triggers of his current depression.  Discussed with patient admission here to [facility] recommendation for inpatient psychiatric treatment.  Patient has never been inpatient or hospitalized with the exception of a brief stay here at [facility] at the facility base crisis center.  Patient becomes anxious and sad at the thought of being admitted here to [facility].  Discussed with patient at length that there is some safety concerns given the fact that he has been missed taking his medication and has verbalized to his counselor that he has thoughts of suicide, patient was unable to tell this writer what medications he was actually taking therefore taking excessive doses of medication could be viewed as a suicidal type behavior as patient was thinking eventually the medication would cause him to die is what he expressed to his therapist.  Patient states that while he was in the fifth grade he attempted to hang himself from a door but the hook broke and he had not made any further attempts in his life.  Patient denies any substance use including vaping or alcohol use patient endorses that his support comes from his grandfather, mother and his friends.  He reports that his friends are aware of his mental health struggles and often come by his home to pick him up in order to do activities outside of the home.  Patient endorses regardless of social activities he is always depressed  and has always felt depressed for most of his life.  Patient denies any symptoms of auditory or visual hallucinations.  Patient denies any feelings that someone is out to get him.  Patient has no prior history of psychosis.  During evaluation [name] is leaning forward, appears visibly fatigued, in no acute distress.  He is alert, oriented x 4,  depressed, flat, anxious, cooperative and attentive. His mood is dysphoric and congruent affect. He has slowed speech, and behavior.  Objectively, patient appears severely depressed, guarded times with statements, although no obvious signs of psychosis or any altered delusional thinking.  Patient repeatedly reports a fear of being alone at home and is unable to elaborate on what exactly he is fearful of.  Patient is able to converse coherently however is preoccupied with being discharged.  Patient endorses suicidal thoughts and has actively been taking medication appropriately knowledge as to whether or not the medication could end his life.  Patient is at risk for harm to self given 2 months ago patient presented here with suicidal ideations with a plan to jump from a bridge, reports persistent depression, persistent thoughts of suicide, and patient is acutely and visibly depressed and hopeless.  Discussed with patient a voluntary admission versus an IVC.  Was amenable to a voluntary admission however he was advised if he attempts to leave he will be placed under IVC petition.  Patient verbalized understanding and agreement with plan.  Patient has been accepted to [facility] behavioral health inpatient hospitalization.  Patient is aware that he will be transferring to the hospital when bed is available. Flowsheet Row ED from 05/03/2023 in [facility] ED from 02/28/2023 in [facility]  C-SSRS RISK CATEGORY High Risk Low Risk       Psychiatric Specialty Exam  Presentation  General Appearance:Appropriate for Environment  Eye Contact:Fleeting  Speech:Slow  Speech Volume:Normal  Handedness:Right   Mood and Affect  Mood: Depressed; Dysphoric; Hopeless  Affect: Flat   Thought Process  Thought Processes: Coherent  Descriptions of Associations:Circumstantial  Orientation:Full (Time, Place and Person)  Thought Content:WDL   Diagnosis of Schizophrenia or Schizoaffective disorder in past: No   Hallucinations:None  Ideas of Reference:Other (comment) (extreme fear of being at home alone and left alone. P)  Suicidal Thoughts:Yes, Active With Intent -- (Denies)  Homicidal Thoughts:No   Sensorium  Memory: Immediate Fair; Recent Good; Remote Good  Judgment: Poor  Insight: Poor   Executive Functions  Concentration: Fair  Attention Span: Fair  Recall: Good  Fund of Knowledge: Fair  Language: Good   Psychomotor Activity  Psychomotor Activity: Normal   Assets  Assets: Communication Skills; Desire for Improvement; Financial Resources/Insurance; Housing; Physical Health; Resilience   Sleep  Sleep: Good  Number of hours:  10   Physical Exam: Physical Exam Constitutional:      General: He is not in acute distress.    Appearance: Normal appearance. He is not ill-appearing.  HENT:     Head: Normocephalic and atraumatic.     Nose: Nose normal.  Eyes:     Extraocular Movements: Extraocular movements intact.     Conjunctiva/sclera: Conjunctivae normal.     Pupils: Pupils are equal, round, and reactive to light.  Cardiovascular:     Rate and Rhythm: Normal rate and regular rhythm.  Pulmonary:     Effort: Pulmonary effort is normal.     Breath sounds: Normal breath sounds.  Musculoskeletal:     Cervical back: Normal range  of motion and neck supple.  Skin:    General: Skin is warm and dry.  Neurological:     General: No focal deficit present.     Mental Status: He is alert and oriented to person, place, and time.     Review of Systems  Psychiatric/Behavioral:  Positive for depression and suicidal ideas. Negative for hallucinations and substance abuse. The patient is nervous/anxious.     Blood pressure 111/63, pulse 60, temperature 98.4 F (36.9 C), temperature source Oral, resp. rate 16, SpO2 100%. There is no height or weight on file to calculate  BMI.  Musculoskeletal: Strength & Muscle Tone: within normal limits Gait & Station: normal Patient leans: Front   [facility] MSE Discharge Disposition for Follow up and Recommendations: Based on my evaluation I certify that psychiatric inpatient services furnished can reasonably be expected to improve the patient's condition which I recommend transfer to an appropriate accepting facility.   1. Severe recurrent major depression with psychotic features, mood-congruent (HCC) (Primary)  - Patient appears odd although denies any psychotic symptoms, patient presentation is bizarre and odd, therefore diagnosis of simple depression requires more invasive workup to rule out any underlying psychotic disorder.  UDS is negative.  Patient appears very guarded and makes several references to being depressed, afraid, of being at home alone during the day.  Patient depression has not improved although has worsened since his recent admission to [facility].  Patient has been accepted for admission to [facility] behavioral health inpatient psychiatric hospital.  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  Consider adding a mood stabilizer if patient's symptoms do not improve with current medication. - ECG NSR with QTc 389 -CBC, CMP, hemoglobin A1c, TSH, Ethanol pending  Cydney Draft, NP 05/03/2023, 7:50 PM

## 2023-05-03 NOTE — BH Assessment (Signed)
 Comprehensive Clinical Assessment (CCA) Note  05/03/2023 [name] [id number]  Chief Complaint:  Chief Complaint  Patient presents with   Suicidal   Visit Diagnosis: Major depressive disorder, recurrent, severe  without psychotic features  The patient demonstrates the following risk factors for suicide: Chronic risk factors for suicide include: psychiatric disorder of depression and previous suicide attempts x1 when he was 18 years old he attempted to hang himself . Acute risk factors for suicide include: social withdrawal/isolation. Protective factors for this patient include: positive social support, positive therapeutic relationship, and hope for the future. Considering these factors, the overall suicide risk at this point appears to be high. Patient is not appropriate for outpatient follow up.     [name] is a 18 year old male with a history of depression presenting to [facility] voluntarily with chief complaint of worsening depression and suicidal ideations. Patient was escorted here by [employer] and school counselor with reports that patient was having SI during session today and was making plans to kill himself. Patient denies making plans to kill himself however did reports telling his therapist that he was suicidal. Patient main stressor is being lonely. Patient lives with his grandfather and reports that when he is not at school, he is at home from 6am to 5pm when his grandfather comes home and even when his grandfather gets home it is little interactions between the two. Patient moved to his grandfather house per mom request so he can have more space and focus on his schoolwork. Patient reports having a good relationship with his family and reports having supportive friends but often feel lonely and depressed. Patient reports depression started in 5th grade and reports he attempted suicide by trying to hang himself. Patient also reports that he did not do well in school and even now he is on  academic probation.   Patient has outpatient therapy with a school counselor, and they meet weekly since last semester. Patient also takes medications which was prescribed to him from when he was here at the [facility]. Patient also went to the ED for anxiety and his medications were refilled. Patient reports over taking his medications when he is home alone. Patient reports taking two extra pills so that he can sleep. However, per triage he told his therapist in session "why hasn't it killed me yet". Patient endorses depressive symptoms of sadness, isolating, crying, feeling irritable, hopeless, helpless, worthless, and irritable. Patient denies alcohol and drug usage.  Patient is a IT sales professional at [employer] Patient is not working and denies access to guns, and he does not have any legal issues. Patient denies P/S/E abuse and trauma.   Patient is oriented x4, alert, cooperative and engaged. Patient eye contact is normal, and his speech is monotone. Patient has a flat affect with depressed mood. Patient reports SI denies plan and reports history of suicide attempt. Patient denies HI, AVH and SIB. Patient is visibly depressed. Patient does not appear psychotic, manic or delusional during assessment.     CCA Screening, Triage and Referral (STR)  Patient Reported Information How did you hear about [place]? School/University  What Is the Reason for Your Visit/Call Today? Pt presents to [facility] voluntarily via [facility]. Pt states he has been abusing his anxiety and depression medication to "stop the pain". Pt states he asked his therapist today "why hasn't it killed me yet". Pt reports SI most recently the other day with no plan. Pt is seeking better medication. Pt denies SI, HI, AVH, Alcohol/Drug use. Per psychologist  Timm Foot: [name] was vwerbalizing SI in the session today and mentioned an overdose of his medications. Pt plans to killhimself when he is discharged from this facility.  How Long Has This Been Causing You  Problems? 1-6 months  What Do You Feel Would Help You the Most Today? Treatment for Depression or other mood problem; Medication(s)   Have You Recently Had Any Thoughts About Hurting Yourself? Yes  Are You Planning to Commit Suicide/Harm Yourself At This time? No   Flowsheet Row ED from 05/03/2023 in [facility] ED from 02/28/2023 in [facility]  C-SSRS RISK CATEGORY High Risk Low Risk       Have you Recently Had Thoughts About Hurting Someone [name]? No  Are You Planning to Harm Someone at This Time? No  Explanation: None.   Have You Used Any Alcohol or Drugs in the Past 24 Hours? No  How Long Ago Did You Use Drugs or Alcohol? NA What Did You Use and How Much? NA  Do You Currently Have a Therapist/Psychiatrist? Yes  Name of Therapist/Psychiatrist: Name of Therapist/Psychiatrist: counselor at [facility]   Have You Been Recently Discharged From Any Office Practice or Programs? No  Explanation of Discharge From Practice/Program: NA    CCA Screening Triage Referral Assessment Type of Contact: Face-to-Face  Telemedicine Service Delivery:   Is this Initial or Reassessment?   Date Telepsych consult ordered in [facility]:    Time Telepsych consult ordered in [facility]:    Location of Assessment: [facility] Assessment Services  Provider Location: [facility] Assessment Services   Collateral Involvement: NONE   Does Patient Have a Automotive engineer Guardian? No  Legal Guardian Contact Information: NA  Copy of Legal Guardianship Form: -- (NA)  Legal Guardian Notified of Arrival: -- (NA)  Legal Guardian Notified of Pending Discharge: -- (NA)  If Minor and Not Living with Parent(s), Who has Custody? NA  Is CPS involved or ever been involved? Never  Is APS involved or ever been involved? Never   Patient Determined To Be At Risk for Harm To Self or Others Based on Review of Patient Reported Information or Presenting Complaint? Yes,  for Self-Harm  Method: No Plan  Availability of Means: No access or NA  Intent: Vague intent or NA  Notification Required: No need or identified person  Additional Information for Danger to Others Potential: -- (NA)  Additional Comments for Danger to Others Potential: NA  Are There Guns or Other Weapons in Your Home? No  Types of Guns/Weapons: NA  Are These Weapons Safely Secured?                            -- (NA)  Who Could Verify You Are Able To Have These Secured: GRANDFATHER  Do You Have any Outstanding Charges, Pending Court Dates, Parole/Probation? DENIES  Contacted To Inform of Risk of Harm To Self or Others: Family/Significant Other:    Does Patient Present under Involuntary Commitment? No    [place] of Residence: [place]   Patient Currently Receiving the Following Services: Individual Therapy   Determination of Need: Urgent (48 hours)   Options For Referral: Outpatient Therapy; Medication Management; Inpatient Hospitalization; Intensive Outpatient Therapy     CCA Biopsychosocial Patient Reported Schizophrenia/Schizoaffective Diagnosis in Past: No   Strengths: Pt is in school.   Mental Health Symptoms Depression:  Difficulty Concentrating; Tearfulness; Fatigue; Hopelessness; Worthlessness; Irritability; Sleep (too much or little); Increase/decrease  in appetite; Change in energy/activity (Isolation.)   Duration of Depressive symptoms: Duration of Depressive Symptoms: Greater than two weeks   Mania:  None   Anxiety:   Difficulty concentrating; Fatigue; Irritability; Restlessness; Sleep; Worrying   Psychosis:  None   Duration of Psychotic symptoms:    Trauma:  None   Obsessions:  None   Compulsions:  None   Inattention:  Disorganized; Forgetful; Loses things; Poor follow-through on tasks; Does not follow instructions (not oppositional)   Hyperactivity/Impulsivity:  Fidgets with hands/feet   Oppositional/Defiant Behaviors:  Angry; None    Emotional Irregularity:  Recurrent suicidal behaviors/gestures/threats   Other Mood/Personality Symptoms:  None.    Mental Status Exam Appearance and self-care  Stature:  Average   Weight:  Average weight   Clothing:  Casual   Grooming:  Normal   Cosmetic use:  None   Posture/gait:  Normal   Motor activity:  Not Remarkable   Sensorium  Attention:  Normal   Concentration:  Normal   Orientation:  X5   Recall/memory:  Normal   Affect and Mood  Affect:  Depressed; Flat   Mood:  Depressed   Relating  Eye contact:  Normal   Facial expression:  Depressed   Attitude toward examiner:  Guarded   Thought and Language  Speech flow: Normal   Thought content:  Appropriate to Mood and Circumstances   Preoccupation:  None   Hallucinations:  None   Organization:  Coherent   [employer] of Knowledge:  Poor   Intelligence:  Average   Abstraction:  Functional   Judgement:  Poor   Reality Testing:  Adequate   Insight:  Fair   Decision Making:  Impulsive   Social Functioning  Social Maturity:  Isolates   Social Judgement:  Heedless   Stress  Stressors:  Other (Comment) (Anxiety, depression.)   Coping Ability:  Overwhelmed   Skill Deficits:  Communication   Supports:  Family; Friends/Service system     Religion: Religion/Spirituality Are You A Religious Person?: No How Might This Affect Treatment?: None.  Leisure/Recreation: Leisure / Recreation Do You Have Hobbies?: Yes Leisure and Hobbies: SKATING  Exercise/Diet: Exercise/Diet Do You Exercise?: No Have You Gained or Lost A Significant Amount of Weight in the Past Six Months?: No Do You Follow a Special Diet?: No Do You Have Any Trouble Sleeping?: Yes   CCA Employment/Education Employment/Work Situation: Employment / Work Situation Employment Situation: Surveyor, minerals Job has Been Impacted by Current Illness: No Has Patient ever Been in the [employer]?:  No  Education: Education Is Patient Currently Attending School?: Yes School Currently Attending: [employer] Last Grade Completed: 12 Did You Product manager?: Yes What Type of College Degree Do you Have?: UNKNOWN Did You Have An Individualized Education Program (IIEP): No Did You Have Any Difficulty At School?: Yes (Focusing.) Were Any Medications Ever Prescribed For These Difficulties?: No Patient's Education Has Been Impacted by Current Illness: No   CCA Family/Childhood History Family and Relationship History: Family history Does patient have children?: No  Childhood History:  Childhood History By whom was/is the patient raised?: Mother, Grandparents Did patient suffer any verbal/emotional/physical/sexual abuse as a child?: No Did patient suffer from severe childhood neglect?: No Has patient ever been sexually abused/assaulted/raped as an adolescent or adult?: No Was the patient ever a victim of a crime or a disaster?: No Witnessed domestic violence?: No Has patient been affected by domestic violence as an adult?: No       CCA Substance  Use Alcohol/Drug Use: Alcohol / Drug Use Pain Medications: See MAR Prescriptions: See MAR Over the Counter: See MAR History of alcohol / drug use?: No history of alcohol / drug abuse Longest period of sobriety (when/how long): None. Negative Consequences of Use:  (None.) Withdrawal Symptoms: None                         ASAM's:  Six Dimensions of Multidimensional Assessment  Dimension 1:  Acute Intoxication and/or Withdrawal Potential:      Dimension 2:  Biomedical Conditions and Complications:      Dimension 3:  Emotional, Behavioral, or Cognitive Conditions and Complications:     Dimension 4:  Readiness to Change:     Dimension 5:  Relapse, Continued use, or Continued Problem Potential:     Dimension 6:  Recovery/Living Environment:     ASAM Severity Score:    ASAM Recommended Level of Treatment:     Substance use  Disorder (SUD)    Recommendations for Services/Supports/Treatments: Recommendations for Services/Supports/Treatments Recommendations For Services/Supports/Treatments: Other (Comment) (Pt to be admitted to [facility] for observation.)  Disposition Recommendation per psychiatric provider: We recommend inpatient psychiatric hospitalization when medically cleared. Patient is under voluntary admission status at this time; please IVC if attempts to leave hospital.   DSM5 Diagnoses: Patient Active Problem List   Diagnosis Date Noted   Strain of flexor muscle of left hip 10/09/2018     Referrals to Alternative Service(s): Referred to Alternative Service(s):   Place:   Date:   Time:    Referred to Alternative Service(s):   Place:   Date:   Time:    Referred to Alternative Service(s):   Place:   Date:   Time:    Referred to Alternative Service(s):   Place:   Date:   Time:     Lonza Robes, [facility]

## 2023-05-03 NOTE — Progress Notes (Signed)
   05/03/23 1607  BHUC Triage Screening (Walk-ins at [facility] only)  What Is the Reason for Your Visit/Call Today? Pt presents to [facility] voluntarily via [employer]. Pt states he has been abusing his anxiety and depression medication to "stop the pain". Pt states he asked his therapist today "why hasn't it killed me yet". Pt reports SI most recently the other day with no plan. Pt is seeking better medication. Pt denies SI, HI, AVH, Alcohol/Drug use. Per psychologist Timm Foot: Norval was vwerbalizing SI in the session today and mentioned an overdose of his medications. Pt plans to killhimself when he is discharged from this facility.  How Long Has This Been Causing You Problems? 1-6 months  Have You Recently Had Any Thoughts About Hurting Yourself? Yes  How long ago did you have thoughts about hurting yourself? other day - no plan  Are You Planning to Commit Suicide/Harm Yourself At This time? No  Have you Recently Had Thoughts About Hurting Someone Marigene Shoulder? No  Are You Planning To Harm Someone At This Time? No  Physical Abuse Denies  Verbal Abuse Denies  Sexual Abuse Denies  Exploitation of patient/patient's resources Denies  Self-Neglect Denies  Are you currently experiencing any auditory, visual or other hallucinations? No  Have You Used Any Alcohol or Drugs in the Past 24 Hours? No  Do you have any current medical co-morbidities that require immediate attention? No  What Do You Feel Would Help You the Most Today? Treatment for Depression or other mood problem;Medication(s)  Determination of Need Routine (7 days)  Options For Referral Outpatient Therapy;Medication Management;Intensive Outpatient Therapy

## 2023-05-04 ENCOUNTER — Encounter: Payer: Self-pay | Admitting: Family Medicine

## 2023-05-04 ENCOUNTER — Inpatient Hospital Stay
Admission: AD | Admit: 2023-05-04 | Discharge: 2023-05-09 | DRG: 885 | Disposition: A | Discharge status: Home / Self Care | Source: Intra-hospital | Attending: Psychiatry | Admitting: Psychiatry

## 2023-05-04 ENCOUNTER — Other Ambulatory Visit: Payer: Self-pay

## 2023-05-04 DIAGNOSIS — F419 Anxiety disorder, unspecified: Secondary | ICD-10-CM | POA: Diagnosis present

## 2023-05-04 DIAGNOSIS — R6250 Unspecified lack of expected normal physiological development in childhood: Secondary | ICD-10-CM | POA: Diagnosis present

## 2023-05-04 DIAGNOSIS — R45851 Suicidal ideations: Secondary | ICD-10-CM | POA: Diagnosis present

## 2023-05-04 DIAGNOSIS — Z79899 Other long term (current) drug therapy: Secondary | ICD-10-CM | POA: Diagnosis not present

## 2023-05-04 DIAGNOSIS — F339 Major depressive disorder, recurrent, unspecified: Principal | ICD-10-CM | POA: Diagnosis present

## 2023-05-04 DIAGNOSIS — Z809 Family history of malignant neoplasm, unspecified: Secondary | ICD-10-CM | POA: Diagnosis not present

## 2023-05-04 DIAGNOSIS — H9190 Unspecified hearing loss, unspecified ear: Secondary | ICD-10-CM | POA: Diagnosis present

## 2023-05-04 MED ORDER — DIPHENHYDRAMINE HCL 50 MG/ML IJ SOLN
50.0000 mg | Freq: Three times a day (TID) | INTRAMUSCULAR | Status: DC | PRN
Start: 2023-05-04 — End: 2023-05-09

## 2023-05-04 MED ORDER — MAGNESIUM HYDROXIDE 400 MG/5ML PO SUSP: 30.0000 mL | Freq: Every day | ORAL | Status: DC | PRN

## 2023-05-04 MED ORDER — HALOPERIDOL LACTATE 5 MG/ML IJ SOLN
5.0000 mg | Freq: Three times a day (TID) | INTRAMUSCULAR | Status: DC | PRN
Start: 2023-05-04 — End: 2023-05-09

## 2023-05-04 MED ORDER — ALUM & MAG HYDROXIDE-SIMETH 200-200-20 MG/5ML PO SUSP: 30.0000 mL | ORAL | Status: DC | PRN

## 2023-05-04 MED ORDER — HALOPERIDOL 5 MG PO TABS
5.0000 mg | ORAL_TABLET | Freq: Three times a day (TID) | ORAL | Status: DC | PRN
Start: 2023-05-04 — End: 2023-05-09

## 2023-05-04 MED ORDER — DIPHENHYDRAMINE HCL 25 MG PO CAPS: 50.0000 mg | ORAL_CAPSULE | Freq: Three times a day (TID) | ORAL | Status: DC | PRN

## 2023-05-04 MED ORDER — OLANZAPINE 5 MG PO TBDP
5.0000 mg | ORAL_TABLET | Freq: Two times a day (BID) | ORAL | Status: DC
  Administered 2023-05-04 – 2023-05-06 (×4): 5 mg via ORAL
  Filled 2023-05-04 (×4): qty 1

## 2023-05-04 MED ORDER — HALOPERIDOL LACTATE 5 MG/ML IJ SOLN: 10.0000 mg | Freq: Three times a day (TID) | INTRAMUSCULAR | Status: DC | PRN

## 2023-05-04 MED ORDER — LORAZEPAM 2 MG/ML IJ SOLN: 2.0000 mg | Freq: Three times a day (TID) | INTRAMUSCULAR | Status: DC | PRN

## 2023-05-04 MED ORDER — HYDROXYZINE HCL 25 MG PO TABS
25.0000 mg | ORAL_TABLET | Freq: Three times a day (TID) | ORAL | Status: DC | PRN
  Administered 2023-05-04 – 2023-05-08 (×4): 25 mg via ORAL
  Filled 2023-05-04 (×4): qty 1

## 2023-05-04 MED ORDER — TRAZODONE HCL 50 MG PO TABS
50.0000 mg | ORAL_TABLET | Freq: Every evening | ORAL | Status: DC | PRN
  Administered 2023-05-04 – 2023-05-08 (×3): 50 mg via ORAL
  Filled 2023-05-04 (×3): qty 1

## 2023-05-04 MED ORDER — FLUOXETINE HCL 20 MG PO CAPS
20.0000 mg | ORAL_CAPSULE | Freq: Every day | ORAL | Status: DC
  Administered 2023-05-04 – 2023-05-07 (×3): 20 mg via ORAL
  Filled 2023-05-04 (×2): qty 1

## 2023-05-04 MED ORDER — ACETAMINOPHEN 325 MG PO TABS: 650.0000 mg | ORAL_TABLET | Freq: Four times a day (QID) | ORAL | Status: DC | PRN

## 2023-05-04 MED ORDER — DIPHENHYDRAMINE HCL 50 MG/ML IJ SOLN: 50.0000 mg | Freq: Three times a day (TID) | INTRAMUSCULAR | Status: DC | PRN

## 2023-05-04 NOTE — Tx Team (Signed)
 Initial Treatment Plan 05/04/2023 3:13 AM [name] ZOX:[id number]    PATIENT STRESSORS: Traumatic event     PATIENT STRENGTHS: Ability for insight    PATIENT IDENTIFIED PROBLEMS: SI                     DISCHARGE CRITERIA:  Ability to meet basic life and health needs  PRELIMINARY DISCHARGE PLAN: Attend aftercare/continuing care group  PATIENT/FAMILY INVOLVEMENT: This treatment plan has been presented to and reviewed with the patient, [name], and/or family member. The patient and family have been given the opportunity to ask questions and make suggestions.  Kenneth Millet, RN 05/04/2023, 3:13 AM

## 2023-05-04 NOTE — [facility] Counselor (Signed)
 CSW attempted to meet with pt for completion of the PSA. CSW knocked and called pt by name but received no response. Pt was sleeping soundly and appeared in no acute distress. CSW will attempt assessment at a later time.   Elvie Hammed. Johnnye Nancy, MSW, LCSW, LCAS 05/04/2023 2:49 PM

## 2023-05-04 NOTE — ED Notes (Signed)
 Patient A&Ox4. Patient currently denies SI/HI and HI. Patient contracts for safety while on unit. Patient denies any physical complaints when asked. No acute distress noted. Support and encouragement provided. Routine safety checks conducted according to facility protocol. Encouraged patient to notify staff if thoughts of harm toward self or others arise. Patient verbalize understanding and agreement. Will continue to monitor for safety.

## 2023-05-04 NOTE — Plan of Care (Signed)
   Problem: Health Behavior/Discharge Planning: Goal: Compliance with treatment plan for underlying cause of condition will improve Outcome: Progressing   Problem: Safety: Goal: Periods of time without injury will increase Outcome: Progressing

## 2023-05-04 NOTE — ED Notes (Signed)
 Patient discharge via ambulatory with a steady gait with Safe Transport staff member. Respirations equal and unlabored, skin warm and dry. No acute distress noted.

## 2023-05-04 NOTE — Group Note (Signed)
 LCSW Group Therapy Note   Group Date: 05/04/2023 Start Time: 1300 End Time: 1400   Type of Therapy and Topic:  Group Therapy: Challenging Core Beliefs  Participation Level:  Did Not Attend  Description of Group:  Patients were educated about core beliefs and asked to identify one harmful core belief that they have. Patients were asked to explore from where those beliefs originate. Patients were asked to discuss how those beliefs make them feel and the resulting behaviors of those beliefs. They were then be asked if those beliefs are true and, if so, what evidence they have to support them. Lastly, group members were challenged to replace those negative core beliefs with helpful beliefs.   Therapeutic Goals:   1. Patient will identify harmful core beliefs and explore the origins of such beliefs. 2. Patient will identify feelings and behaviors that result from those core beliefs. 3. Patient will discuss whether such beliefs are true. 4.  Patient will replace harmful core beliefs with helpful ones.  Summary of Patient Progress:  Patient did not attend.   Therapeutic Modalities: Cognitive Behavioral Therapy; Solution-Focused Therapy   Avianna Moynahan M Amanie Mcculley, LCSWA 05/04/2023  2:00 PM

## 2023-05-04 NOTE — BH Assessment (Signed)
 Admission Note:  18 yr male who presents voluntary no acute distress for the treatment of SI and Depression. Pt appears flat and sad. Pt was calm and cooperative with admission process. PT denies si,hi and avh. Patient is depressed he states. He attend weekly counseling sessions with his counselor and expressed that he has been extremely depressed. Patient is a Consulting civil engineer at [employer] which is a stressor for him since he completed with a below average GPA. Patient lives with grandfather.Skin was assessed and found to be clear of any abnormal marks. PT searched and no contraband found, POC and unit policies explained and understanding verbalized. Consents obtained. Food and fluids offered, and fluids accepted. Pt had no additional questions or concerns

## 2023-05-04 NOTE — Group Note (Signed)
 Recreation Therapy Group Note   Group Topic:Coping Skills  Group Date: 05/04/2023 Start Time: 1000 End Time: 1050 Facilitators: Deatrice Factor, LRT, CTRS Location:  [facility]  Group Description: Mind Map.  Patient was provided a blank template of a diagram with 32 blank boxes in a tiered system, branching from the center (similar to a bubble chart). LRT directed patients to label the middle of the diagram "Coping Skills". LRT and patients then came up with 8 different coping skills as examples. Pt were directed to record their coping skills in the 2nd tier boxes closest to the center.  Patients would then share their coping skills with the group as LRT wrote them out. LRT gave a handout of 99 different coping skills at the end of group.   Goal Area(s) Addressed: Patients will be able to define "coping skills". Patient will identify new coping skills.  Patient will increase communication.   Affect/Mood: N/A   Participation Level: Did not attend    Clinical Observations/Individualized Feedback: Patient did not attend group.   Plan: Continue to engage patient in RT group sessions 2-3x/week.   Deatrice Factor, LRT, CTRS 05/04/2023 1:15 PM

## 2023-05-04 NOTE — Progress Notes (Signed)
 Pt calm and pleasant during assessment denying HI/AVH. Pt endorses passive SI, but couldn't state why. Pt isolative to his room tonight, compliant with medication administration per MD orders. Pt given education, support, and encouragement to be active in his treatment plan. Pt being monitored Q 15 minutes for safety per unit protocol, remains safe on the unit

## 2023-05-04 NOTE — ED Notes (Signed)
 Pt observed/assessed in recliner sleeping. RR even and unlabored, appearing in no noted distress. Environmental check complete, will continue to monitor for safety

## 2023-05-04 NOTE — Progress Notes (Addendum)
 Patient presents with flat affect and isolative to room. Patient endorses passive SI but denies HI and A/V/H with no plan or intent. Patient remains cooperative in unit.    05/04/23 0849  Psych Admission Type (Psych Patients Only)  Admission Status Voluntary  Psychosocial Assessment  Patient Complaints Depression  Eye Contact Fair  Facial Expression Flat;Sad  Affect Depressed  Speech Logical/coherent  Interaction Guarded;Minimal  Motor Activity Slow  Appearance/Hygiene Unremarkable  Behavior Characteristics Cooperative;Calm  Mood Depressed;Sad  Thought Process  Coherency WDL  Content WDL  Delusions None reported or observed  Perception WDL  Hallucination None reported or observed  Judgment Poor  Confusion None  Danger to Self  Current suicidal ideation? Passive  Self-Injurious Behavior No self-injurious ideation or behavior indicators observed or expressed   Agreement Not to Harm Self Yes  Description of Agreement verbally contracts for safety  Danger to Others  Danger to Others None reported or observed

## 2023-05-04 NOTE — Plan of Care (Signed)
 New Patient. Has not had time to progress. Problem: Education: Goal: Knowledge of [facility] Education information/materials will improve Outcome: Not Progressing Goal: Emotional status will improve Outcome: Not Progressing Goal: Mental status will improve Outcome: Not Progressing Goal: Verbalization of understanding the information provided will improve Outcome: Not Progressing   Problem: Activity: Goal: Interest or engagement in activities will improve Outcome: Not Progressing Goal: Sleeping patterns will improve Outcome: Not Progressing

## 2023-05-04 NOTE — Progress Notes (Signed)
 Patient slept throughout day and stated Zyprexa is making him extra sleepy. 1700 dose held per patient request. Patient provided with snacks since he did not eat dinner or lunch. Patient remains cooperative in unit.

## 2023-05-04 NOTE — H&P (Signed)
 Psychiatric Admission Assessment Adult  Patient Identification: [name] MRN:  [id number] Date of Evaluation:  05/04/2023 Chief Complaint:  MDD (major depressive disorder), recurrent episode, with atypical features (HCC) [F33.9] Principal Diagnosis: MDD (major depressive disorder), recurrent episode, with atypical features (HCC) Diagnosis:  Principal Problem:   MDD (major depressive disorder), recurrent episode, with atypical features (HCC)  History of Present Illness: [name] is  18 year-old male patient who  presented to [facility]  on 05/03/2023 for a psychiatric evaluation.  He presented  with history of depression with suicidal thoughts and was  accompanied by [facility] and counselor from [facility]. Patient was attending a weekly counseling session and revealed to his therapist that he had been taking extra doses of his hydroxyzine to facilitate sleep when he was severely depressed.  He reported that  he was taking his fluoxetine 1 tablet/day as normal.  He has not established with a medication provider and has been taking doses of medication that he was able to get a refill from the emergency department on 03/07/2023.  Patient was admitted  to the [facility]  crisis center on 02/28/2023. Due to suicidal ideations with a plan of jumping off of a bridge.  During his admission he was started on fluoxetine and hydroxyzine and subsequently discharged with these medications.  Patient was instructed to follow-up here at the [facility] outpatient clinic but reported that he never was able to follow-up.   Due  to worsening depression, patient  was unable to successfully complete his first semester at [employer] and was placed on academic and financial aid probation and is currently in special classes to help him improve his grade point average. Patient reported since high school he has struggled with schoolwork and deniee that he was ever placed in any special classes or given additional time for assignments.  Patient is  currently residing with his grandfather following his admission at [facility] and reports he moved in with his grandfather in order to have his own room.  He reports he is alone for several hours throughout the day as his grandfather works full-time and this  isolation is worsening his symptoms of depression.  Upon face-to-face evaluation today:   Patient is seen in bed, awake after many hours of sleep. Alert and oriented x 4. He is cooperative and calm. He appears healthy and well nourished. He does not seem to be preoccupied or responding to internal stimuli. Affect is flat. Speech is clear, coherent and well articulated/age appropriate. His eye contact is fair. Patient denies SI/HI/AVH. He admits that he had been taking more than prescribed doses to cope with his stress. School id the trigger: Patient reports that he got overwhelmed with school work and thought taking more medications would help. He reports a hx of ADHD and took medications when he was in middle school. Currently no medications to address ADHD and states "I am not sure if I need to get back on medications". Patient reports he is thinking about just taking one class in summer, to try not to overwhelm himself again. Patient reports he lives with his grandfather and it gets lonely at home as grandpa has to work. He has friends "but they would be busy with school work". Patient reports that he has good family support and there is no hx of abuse or neglect. He is currently single, not dating. He denies substance use and does not use nicotine.  Denies medical issues, past or present.   Patient verbalizes understanding of his  reason for admission. He is encouraged to take time to reflect on himself and rest. He is encouraged to communicate his concerns as needed. Emotional support provided.    Flowsheet Row ED from 05/03/2023 in [place]    Associated Signs/Symptoms: Depression Symptoms:  depressed  mood, anhedonia, hypersomnia, fatigue, hopelessness, loss of energy/fatigue, (Hypo) Manic Symptoms:  Irritable Mood, Anxiety Symptoms:  Excessive Worry, Psychotic Symptoms:   NA PTSD Symptoms: NA Total Time spent with patient: 30 minutes  Past Psychiatric History: MDD, ADHD  Is the patient at risk to self? Yes.    Has the patient been a risk to self in the past 6 months? Yes.    Has the patient been a risk to self within the distant past? No.  Is the patient a risk to others? No.  Has the patient been a risk to others in the past 6 months? No.  Has the patient been a risk to others within the distant past? No.   [place] Scale:  Flowsheet Row Admission (Current) from 05/04/2023 in [facility] INPATIENT BEH[facility] MEDICINE ED from 05/03/2023 in [facility] ED from 02/28/2023 in [facility]  C-SSRS RISK CATEGORY High Risk High Risk Low Risk        Prior Inpatient Therapy: No. If yes, describe NA  Prior Outpatient Therapy: Yes.   If yes, describe current   Alcohol Screening: 1. How often do you have a drink containing alcohol?: Never 2. How many drinks containing alcohol do you have on a typical day when you are drinking?: 1 or 2 3. How often do you have six or more drinks on one occasion?: Never AUDIT-C Score: 0 4. How often during the last year have you found that you were not able to stop drinking once you had started?: Never 5. How often during the last year have you failed to do what was normally expected from you because of drinking?: Never 6. How often during the last year have you needed a first drink in the morning to get yourself going after a heavy drinking session?: Never 7. How often during the last year have you had a feeling of guilt of remorse after drinking?: Never 8. How often during the last year have you been unable to remember what happened the night before because you had been drinking?: Never 9. Have you or  someone else been injured as a result of your drinking?: No 10. Has a relative or friend or a doctor or another health worker been concerned about your drinking or suggested you cut down?: No Alcohol Use Disorder Identification Test Final Score (AUDIT): 0 Substance Abuse History in the last 12 months:  No. 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   History of acute respiratory failure 12/2004   in setting RSV and reactive airway disease//  respiratory failure as newborn born at 30 wks   History of pneumonia 03/02/2005   w/ right otitis media and thrush   Hypertropia of right eye    Immunizations up to date    Ocular torticollis    both eyes   Third nerve palsy     Past Surgical History:  Procedure  Laterality Date   MRI  12/18/2006   SEDATION   MUSCLE RECESSION AND RESECTION Bilateral 09/16/2015   Procedure: LEFT SUPERIOR OBLIQUE TENDON EXPANDER ,  LEFT INFERIOR OBLIQUE RESECTION/ ADVANCEMENT, RIGHT INFERIOR OBLIQUE RECESSION;  Surgeon: Aura Camps, MD;  Location: [facility];  Service: Ophthalmology;  Laterality: Bilateral;   NASOLACRIMAL DUCT PROBING Bilateral 07/27/2007   and Left superior oblique nasal tenectomy/  Left inferior & lateral rectus muscule   REMOVAL LEFT EAR CYST  age 21 or 4   SUPERIOR OBLIQUE TENOTOMY Right 01/02/2009   REPEAT FOR RESIDUAL BROWN SYNDROME   TRANSTHORACIC ECHOCARDIOGRAM  04/07/2004   normal cardiac structure and function w/ no evidence aortic stenosis (mean transaortic vavle grandient 2.61mmHg),  ef 55-60%   Family History:  Family History  Problem Relation Age of Onset   Cancer Other    Family Psychiatric  History: NA Tobacco Screening:  Social  History   Tobacco Use  Smoking Status Never  Smokeless Tobacco Never    BH Tobacco Counseling     Are you interested in Tobacco Cessation Medications?  No value filed. Counseled patient on smoking cessation:  No value filed. Reason Tobacco Screening Not Completed: No value filed.       Social History:  Social History   Substance and Sexual Activity  Alcohol Use Never     Social History   Substance and Sexual Activity  Drug Use Never    Additional Social History:                           Allergies:  No Known Allergies Lab Results:  Results for orders placed or performed during the hospital encounter of 05/03/23 (from the past 48 hours)  CBC with Differential/Platelet     Status: None   Collection Time: 05/03/23  6:37 PM  Result Value Ref Range   WBC 4.2 4.0 - 10.5 K/uL   RBC 4.69 4.22 - 5.81 MIL/uL   Hemoglobin 13.4 13.0 - 17.0 g/dL   HCT 16.1 09.6 - 04.5 %   MCV 85.9 80.0 - 100.0 fL   MCH 28.6 26.0 - 34.0 pg   MCHC 33.3 30.0 - 36.0 g/dL   RDW 40.9 81.1 - 91.4 %   Platelets 298 150 - 400 K/uL   nRBC 0.0 0.0 - 0.2 %   Neutrophils Relative % 42 %   Neutro Abs 1.8 1.7 - 7.7 K/uL   Lymphocytes Relative 44 %   Lymphs Abs 1.9 0.7 - 4.0 K/uL   Monocytes Relative 8 %   Monocytes Absolute 0.3 0.1 - 1.0 K/uL   Eosinophils Relative 5 %   Eosinophils Absolute 0.2 0.0 - 0.5 K/uL   Basophils Relative 1 %   Basophils Absolute 0.0 0.0 - 0.1 K/uL   Immature Granulocytes 0 %   Abs Immature Granulocytes 0.01 0.00 - 0.07 K/uL    Comment: Performed at [facility], [address] [place], [place] [address]  Comprehensive metabolic panel     Status: Abnormal   Collection Time: 05/03/23  6:37 PM  Result Value Ref Range   Sodium 137 135 - 145 mmol/L   Potassium 4.0 3.5 - 5.1 mmol/L   Chloride 101 98 - 111 mmol/L   CO2 28 22 - 32 mmol/L   Glucose, Bld 125 (H) 70 - 99 mg/dL    Comment: Glucose reference range applies only to samples taken after fasting for at  least 8 hours.  BUN 12 6 - 20 mg/dL   Creatinine, Ser 1.61 0.61 - 1.24 mg/dL   Calcium 9.7 8.9 - 09.6 mg/dL   Total Protein 7.1 6.5 - 8.1 g/dL   Albumin 4.4 3.5 - 5.0 g/dL   AST 21 15 - 41 U/L   ALT 13 0 - 44 U/L   Alkaline Phosphatase 83 38 - 126 U/L   Total Bilirubin 1.3 (H) 0.0 - 1.2 mg/dL   GFR, Estimated >04 >54 mL/min    Comment: (NOTE) Calculated using the CKD-EPI Creatinine Equation (2021)    Anion gap 8 5 - 15    Comment: Performed at [facility], [address], [place], [place] [address]  Hemoglobin A1c     Status: Abnormal   Collection Time: 05/03/23  6:37 PM  Result Value Ref Range   Hgb A1c MFr Bld 4.5 (L) 4.8 - 5.6 %    Comment: (NOTE) Pre diabetes:          5.7%-6.4%  Diabetes:              >6.4%  Glycemic control for   <7.0% adults with diabetes    Mean Plasma Glucose 82.45 mg/dL    Comment: Performed at [facility], [address], [place], [place] [address]  Lipid panel     Status: None   Collection Time: 05/03/23  6:37 PM  Result Value Ref Range   Cholesterol 119 0 - 200 mg/dL   Triglycerides 72 <295 mg/dL   HDL 51 >62 mg/dL   Total CHOL/HDL Ratio 2.3 RATIO   VLDL 14 0 - 40 mg/dL   LDL Cholesterol 54 0 - 99 mg/dL    Comment:        Total Cholesterol/HDL:CHD Risk Coronary Heart Disease Risk Table                     Men   Women  1/2 Average Risk   3.4   3.3  Average Risk       5.0   4.4  2 X Average Risk   9.6   7.1  3 X Average Risk  23.4   11.0        Use the calculated Patient Ratio above and the CHD Risk Table to determine the patient's CHD Risk.        ATP III CLASSIFICATION (LDL):  <100     mg/dL   Optimal  130-865  mg/dL   Near or Above                    Optimal  130-159  mg/dL   Borderline  784-696  mg/dL   High  >295     mg/dL   Very High Performed at [facility], [address] [place], [place] [address]   Ethanol     Status: None   Collection Time: 05/03/23  6:37 PM  Result Value Ref Range   Alcohol,  Ethyl (B) <10 <10 mg/dL    Comment: (NOTE) For medical purposes only. Performed at [facility], [address], [place], [place] [address]   TSH     Status: None   Collection Time: 05/03/23  6:37 PM  Result Value Ref Range   TSH 2.034 0.350 - 4.500 uIU/mL    Comment: Performed by a 3rd Generation assay with a functional sensitivity of <=0.01 uIU/mL. Performed at [facility], [address], [place], [place] [address]  POCT Urine Drug Screen - (I-Screen)     Status: Normal   Collection Time: 05/03/23  6:41 PM  Result Value Ref Range   POC Amphetamine UR None Detected    POC Secobarbital (BAR) None Detected    POC Buprenorphine (BUP) None Detected    POC Oxazepam (BZO) None Detected    POC Cocaine UR None Detected    POC Methamphetamine UR None Detected    POC Morphine None Detected    POC Methadone UR None Detected    POC Oxycodone UR None Detected    POC Marijuana UR None Detected     Blood Alcohol level:  Lab Results  Component Value Date   ETH <10 05/03/2023   ETH <10 03/07/2023    Metabolic Disorder Labs:  Lab Results  Component Value Date   HGBA1C 4.5 (L) 05/03/2023   MPG 82.45 05/03/2023   No results found for: "PROLACTIN" Lab Results  Component Value Date   CHOL 119 05/03/2023   TRIG 72 05/03/2023   HDL 51 05/03/2023   CHOLHDL 2.3 05/03/2023   VLDL 14 05/03/2023   LDLCALC 54 05/03/2023    Current Medications: Current Facility-Administered Medications  Medication Dose Route Frequency Provider Last Rate Last Admin   acetaminophen (TYLENOL) tablet 650 mg  650 mg Oral Q6H PRN Buena Carmine, NP       alum & mag hydroxide-simeth (MAALOX/MYLANTA) 200-200-20 MG/5ML suspension 30 mL  30 mL Oral Q4H PRN Buena Carmine, NP       haloperidol (HALDOL) tablet 5 mg  5 mg Oral TID PRN Buena Carmine, NP       And   diphenhydrAMINE (BENADRYL) capsule 50 mg  50 mg Oral TID PRN Buena Carmine, NP       haloperidol lactate (HALDOL)  injection 5 mg  5 mg Intramuscular TID PRN Buena Carmine, NP       And   diphenhydrAMINE (BENADRYL) injection 50 mg  50 mg Intramuscular TID PRN Buena Carmine, NP       And   LORazepam (ATIVAN) injection 2 mg  2 mg Intramuscular TID PRN Buena Carmine, NP       haloperidol lactate (HALDOL) injection 10 mg  10 mg Intramuscular TID PRN Buena Carmine, NP       And   diphenhydrAMINE (BENADRYL) injection 50 mg  50 mg Intramuscular TID PRN Buena Carmine, NP       And   LORazepam (ATIVAN) injection 2 mg  2 mg Intramuscular TID PRN Buena Carmine, NP       FLUoxetine (PROZAC) capsule 20 mg  20 mg Oral QHS Buena Carmine, NP       hydrOXYzine (ATARAX) tablet 25 mg  25 mg Oral TID PRN Buena Carmine, NP       magnesium hydroxide (MILK OF MAGNESIA) suspension 30 mL  30 mL Oral Daily PRN Buena Carmine, NP       OLANZapine zydis (ZYPREXA) disintegrating tablet 5 mg  5 mg Oral BID Buena Carmine, NP   5 mg at 05/04/23 0849   traZODone (DESYREL) tablet 50 mg  50 mg Oral QHS PRN Buena Carmine, NP       PTA Medications: Medications Prior to Admission  Medication Sig Dispense Refill Last Dose/Taking   FLUoxetine (PROZAC) 10 MG capsule Take 1 capsule (10 mg total) by mouth daily. 30 capsule 3    hydrOXYzine (ATARAX) 25 MG tablet Take 1  tablet (25 mg total) by mouth every 6 (six) hours. 12 tablet 0     Musculoskeletal: Strength & Muscle Tone: within normal limits Gait & Station: normal Patient leans: N/A            Psychiatric Specialty Exam:  Presentation  General Appearance:  Appropriate for Environment  Eye Contact: Fleeting  Speech: Slow  Speech Volume: Normal  Handedness: Right   Mood and Affect  Mood: Depressed; Dysphoric; Hopeless  Affect: Flat   Thought Process  Thought Processes: Coherent  Duration of Psychotic Symptoms:N/A Past Diagnosis of Schizophrenia or Psychoactive disorder: No  Descriptions of  Associations:Circumstantial  Orientation:Full (Time, Place and Person)  Thought Content:WDL  Hallucinations:Hallucinations: None  Ideas of Reference:Other (comment) (extreme fear of being at home alone and left alone. P)  Suicidal Thoughts:Suicidal Thoughts: Yes, Active SI Active Intent and/or Plan: With Intent  Homicidal Thoughts:Homicidal Thoughts: No   Sensorium  Memory: Immediate Fair; Recent Good; Remote Good  Judgment: Poor  Insight: Poor   Executive Functions  Concentration: Fair  Attention Span: Fair  Recall: Good  Fund of Knowledge: Fair  Language: Good   Psychomotor Activity  Psychomotor Activity: Psychomotor Activity: Normal   Assets  Assets: Communication Skills; Desire for Improvement; Financial Resources/Insurance; Housing; Physical Health; Resilience   Sleep  Sleep: Sleep: Good    Physical Exam: Physical Exam Vitals and nursing note reviewed.  Constitutional:      Appearance: Normal appearance. He is normal weight.  HENT:     Head: Normocephalic and atraumatic.     Right Ear: Tympanic membrane normal.     Left Ear: Tympanic membrane normal.     Nose: Nose normal.     Mouth/Throat:     Mouth: Mucous membranes are moist.  Eyes:     Extraocular Movements: Extraocular movements intact.     Pupils: Pupils are equal, round, and reactive to light.  Cardiovascular:     Rate and Rhythm: Normal rate.     Pulses: Normal pulses.  Pulmonary:     Effort: Pulmonary effort is normal.  Musculoskeletal:        General: Normal range of motion.     Cervical back: Normal range of motion and neck supple.  Neurological:     General: No focal deficit present.     Mental Status: He is alert and oriented to person, place, and time.    Review of Systems  Constitutional: Negative.   HENT: Negative.    Eyes: Negative.   Respiratory: Negative.    Cardiovascular: Negative.   Gastrointestinal: Negative.   Genitourinary: Negative.    Musculoskeletal: Negative.   Skin: Negative.   Neurological: Negative.   Endo/Heme/Allergies: Negative.   Psychiatric/Behavioral:  Positive for depression. The patient is nervous/anxious.    Blood pressure 107/64, pulse (!) 54, temperature 97.7 F (36.5 C), temperature source Oral, resp. rate 18, height 5\' 9"  (1.753 m), weight 56.2 kg, SpO2 100%. Body mass index is 18.31 kg/m.  Treatment Plan Summary: Daily contact with patient to assess and evaluate symptoms and progress in treatment and Medication management  Observation Level/Precautions:  Continuous Observation 15 minute checks  Laboratory:   NA  Psychotherapy:    Medications:  Prozac, Hydroxyzine  Consultations:  NA  Discharge Concerns:  NA  Estimated LOS:2-4 days  Other:     Physician Treatment Plan for Primary Diagnosis: MDD (major depressive disorder), recurrent episode, with atypical features (HCC) Long Term Goal(s): Improvement in symptoms so as ready for discharge  Short Term Goals:  Ability to identify changes in lifestyle to reduce recurrence of condition will improve, Ability to verbalize feelings will improve, Ability to disclose and discuss suicidal ideas, Ability to demonstrate self-control will improve, Ability to identify and develop effective coping behaviors will improve, Ability to maintain clinical measurements within normal limits will improve, and Compliance with prescribed medications will improve  Physician Treatment Plan for Secondary Diagnosis: Principal Problem:   MDD (major depressive disorder), recurrent episode, with atypical features (HCC)  Long Term Goal(s): Improvement in symptoms so as ready for discharge  Short Term Goals: Ability to identify changes in lifestyle to reduce recurrence of condition will improve, Ability to verbalize feelings will improve, Ability to disclose and discuss suicidal ideas, Ability to demonstrate self-control will improve, Ability to identify and develop effective  coping behaviors will improve, Ability to maintain clinical measurements within normal limits will improve, and Compliance with prescribed medications will improve  I certify that inpatient services furnished can reasonably be expected to improve the patient's condition.    Elston Halsted, NP 4/17/202512:55 PM

## 2023-05-05 NOTE — Group Note (Signed)
 Date:  05/05/2023 Time:  8:50 PM  Group Topic/Focus:  Wellness Toolbox:   The focus of this group is to discuss various aspects of wellness, balancing those aspects and exploring ways to increase the ability to experience wellness.  Patients will create a wellness toolbox for use upon discharge.    Participation Level:  Did Not Attend  Participation Quality:   none  Affect:   none  Cognitive:   none  Insight: None  Engagement in Group:   none  Modes of Intervention:   none  Additional Comments:  none   Kerri Katz 05/05/2023, 8:50 PM

## 2023-05-05 NOTE — Progress Notes (Signed)
 [facility] MD Progress Note  05/05/2023 4:25 PM [name]  MRN:  [id number] Subjective:  "My goal is to get rid of depression" Principal Problem: MDD (major depressive disorder), recurrent episode, with atypical features (HCC) Diagnosis: Principal Problem:   MDD (major depressive disorder), recurrent episode, with atypical features (HCC)  [name] is  18 year-old male patient who  presented to [facility]  on 05/03/2023 for a psychiatric evaluation.  He presented  with history of depression with suicidal thoughts and was  accompanied by [facility] and counselor from [facility]. Patient was attending a weekly counseling session and revealed to his therapist that he had been taking extra doses of his hydroxyzine  to facilitate sleep when he was severely depressed.  He reported that  he was taking his fluoxetine  1 tablet/day as normal.  He has not established with a medication provider and has been taking doses of medication that he was able to get a refill from the emergency department on 03/07/2023.  Patient was admitted  to the [facility]  crisis center on 02/28/2023. Due to suicidal ideations with a plan of jumping off of a bridge.  During his admission he was started on fluoxetine  and hydroxyzine  and subsequently discharged with these medications.  Patient was instructed to follow-up here at the [facility] outpatient clinic but reported that he never was able to follow-up.    Due  to worsening depression, patient  was unable to successfully complete his first semester at [employer] and was placed on academic and financial aid probation and is currently in special classes to help him improve his grade point average. Patient reported since high school he has struggled with schoolwork and deniee that he was ever placed in any special classes or given additional time for assignments.  Patient is currently residing with his grandfather following his admission at [facility] and reports he moved in with his grandfather in order to have his own  room.  He reports he is alone for several hours throughout the day as his grandfather works full-time and this  isolation is worsening his symptoms of depression.   Follow up assessment today" :    Patient is seen in the assessment room. He participated in his treatment team and remained cooperative. Alert and oriented x 4. He is cooperative and calm. He appears healthy and well nourished. He does not seem to be preoccupied or responding to internal stimuli. Affect is flat. Speech is clear, coherent and well articulated/age appropriate. His eye contact is fair. Patient denies SI/HI/AVH. He admits that he had been taking more than prescribed doses to cope with his stress. School id the trigger: Patient reports that he got overwhelmed with school work and thought taking more medications would help. He reports a hx of ADHD and took medications when he was in middle school. Currently no medications to address ADHD and states "I am not sure if I need to get back on medications". Patient reports he is thinking about just taking one class in summer, to try not to overwhelm himself again. Patient reports he lives with his grandfather and it gets lonely at home as grandpa has to work. He has friends "but they would be busy with school work". Patient reports that he has good family support and there is no hx of abuse or neglect. He is currently single, not dating. He denies substance use and does not use nicotine.  Denies medical issues, past or present.   Patient reported that his goal is to get  rid of his depression by being more serious with therapy and by taking medicatiobns. He reports no concerns with current medication regimen. He is more and more in the milieu though he continues to show signs of depression including flat affect, sadness and decreased energy/decreased appetite.      Total Time spent with patient: 30 minutes  Past Psychiatric History: MDD, ADHD  Past Medical History:  Past Medical  History:  Diagnosis Date   Bevin Bucks syndrome, left eye    Congenital macrocephaly    mild   Development delay    Environmental allergies    Hearing loss    congenital --  no aids   History of acute respiratory failure 12/2004   in setting RSV and reactive airway disease//  respiratory failure as newborn born at 30 wks   History of pneumonia 03/02/2005   w/ right otitis media and thrush   Hypertropia of right eye    Immunizations up to date    Ocular torticollis    both eyes   Third nerve palsy     Past Surgical History:  Procedure Laterality Date   MRI  12/18/2006   SEDATION   MUSCLE RECESSION AND RESECTION Bilateral 09/16/2015   Procedure: LEFT SUPERIOR OBLIQUE TENDON EXPANDER ,  LEFT INFERIOR OBLIQUE RESECTION/ ADVANCEMENT, RIGHT INFERIOR OBLIQUE RECESSION;  Surgeon: Lorena Rolling, MD;  Location: [facility];  Service: Ophthalmology;  Laterality: Bilateral;   NASOLACRIMAL DUCT PROBING Bilateral 07/27/2007   and Left superior oblique nasal tenectomy/  Left inferior & lateral rectus muscule   REMOVAL LEFT EAR CYST  age 28 or 4   SUPERIOR OBLIQUE TENOTOMY Right 01/02/2009   REPEAT FOR RESIDUAL BROWN SYNDROME   TRANSTHORACIC ECHOCARDIOGRAM  04/07/2004   normal cardiac structure and function w/ no evidence aortic stenosis (mean transaortic vavle grandient 2.54mmHg),  ef 55-60%   Family History:  Family History  Problem Relation Age of Onset   Cancer Other    Family Psychiatric  History: NA Social History:  Social History   Substance and Sexual Activity  Alcohol Use Never     Social History   Substance and Sexual Activity  Drug Use Never    Social History   Socioeconomic History   Marital status: Single    Spouse name: Not on file   Number of children: Not on file   Years of education: Not on file   Highest education level: Not on file  Occupational History   Not on file  Tobacco Use   Smoking status: Never   Smokeless tobacco: Never  Vaping Use    Vaping status: Never Used  Substance and Sexual Activity   Alcohol use: Never   Drug use: Never   Sexual activity: Never  Other Topics Concern   Not on file  Social History Narrative   BORN AT 30 WKS  In NICU FOR 1 MONTH FOR RESPIRATORY/ GI FAILURE.        NO PT/  FAMILY ANESTHESIA PROBLEMS.      NO SMOKER IN HOME.      LIVES W/ MOTHER.  NO CUSTODY ISSUES.      6 TH GRADE AT [employer]    Social Drivers of Health   Financial Resource Strain: Not at Risk (03/07/2022)   Received from [employer]    Financial Resource Strain: 1  Food Insecurity: No Food Insecurity (05/04/2023)   Hunger Vital Sign    Worried About Running Out of  Food in the Last Year: Never true    Ran Out of Food in the Last Year: Never true  Transportation Needs: No Transportation Needs (05/04/2023)   PRAPARE - Administrator, Civil Service (Medical): No    Lack of Transportation (Non-Medical): No  Physical Activity: Not on File (02/22/2022)   Received from [facility]   Physical Activity    Physical Activity: 0  Stress: Not on File (02/22/2022)   Received from [facility]   Stress    Stress: 0  Social Connections: Not on File (09/27/2022)   Received from [employer]   Social Connections    Connectedness: 0   Additional Social History:                         Sleep: Good  Appetite:  Poor  Current Medications: Current Facility-Administered Medications  Medication Dose Route Frequency Provider Last Rate Last Admin   acetaminophen  (TYLENOL ) tablet 650 mg  650 mg Oral Q6H PRN Buena Carmine, NP       alum & mag hydroxide-simeth (MAALOX/MYLANTA) 200-200-20 MG/5ML suspension 30 mL  30 mL Oral Q4H PRN Buena Carmine, NP       haloperidol  (HALDOL ) tablet 5 mg  5 mg Oral TID PRN Buena Carmine, NP       And   diphenhydrAMINE  (BENADRYL ) capsule 50 mg  50 mg Oral TID PRN Buena Carmine, NP       haloperidol  lactate (HALDOL ) injection 5 mg  5 mg  Intramuscular TID PRN Buena Carmine, NP       And   diphenhydrAMINE  (BENADRYL ) injection 50 mg  50 mg Intramuscular TID PRN Buena Carmine, NP       And   LORazepam  (ATIVAN ) injection 2 mg  2 mg Intramuscular TID PRN Buena Carmine, NP       haloperidol  lactate (HALDOL ) injection 10 mg  10 mg Intramuscular TID PRN Buena Carmine, NP       And   diphenhydrAMINE  (BENADRYL ) injection 50 mg  50 mg Intramuscular TID PRN Buena Carmine, NP       And   LORazepam  (ATIVAN ) injection 2 mg  2 mg Intramuscular TID PRN Buena Carmine, NP       FLUoxetine  (PROZAC ) capsule 20 mg  20 mg Oral QHS Buena Carmine, NP   20 mg at 05/04/23 2103   hydrOXYzine  (ATARAX ) tablet 25 mg  25 mg Oral TID PRN Buena Carmine, NP   25 mg at 05/04/23 2103   magnesium  hydroxide (MILK OF MAGNESIA) suspension 30 mL  30 mL Oral Daily PRN Buena Carmine, NP       OLANZapine  zydis (ZYPREXA ) disintegrating tablet 5 mg  5 mg Oral BID Buena Carmine, NP   5 mg at 05/05/23 0911   traZODone  (DESYREL ) tablet 50 mg  50 mg Oral QHS PRN Buena Carmine, NP   50 mg at 05/04/23 2103    Lab Results:  Results for orders placed or performed during the hospital encounter of 05/03/23 (from the past 48 hours)  CBC with Differential/Platelet     Status: None   Collection Time: 05/03/23  6:37 PM  Result Value Ref Range   WBC 4.2 4.0 - 10.5 K/uL   RBC 4.69 4.22 - 5.81 MIL/uL   Hemoglobin 13.4 13.0 - 17.0 g/dL   HCT 10.2 72.5 - 36.6 %   MCV 85.9 80.0 - 100.0 fL  MCH 28.6 26.0 - 34.0 pg   MCHC 33.3 30.0 - 36.0 g/dL   RDW 40.9 81.1 - 91.4 %   Platelets 298 150 - 400 K/uL   nRBC 0.0 0.0 - 0.2 %   Neutrophils Relative % 42 %   Neutro Abs 1.8 1.7 - 7.7 K/uL   Lymphocytes Relative 44 %   Lymphs Abs 1.9 0.7 - 4.0 K/uL   Monocytes Relative 8 %   Monocytes Absolute 0.3 0.1 - 1.0 K/uL   Eosinophils Relative 5 %   Eosinophils Absolute 0.2 0.0 - 0.5 K/uL   Basophils Relative 1 %   Basophils Absolute 0.0  0.0 - 0.1 K/uL   Immature Granulocytes 0 %   Abs Immature Granulocytes 0.01 0.00 - 0.07 K/uL    Comment: Performed at [facility], [address], [place], [place] [address]  Comprehensive metabolic panel     Status: Abnormal   Collection Time: 05/03/23  6:37 PM  Result Value Ref Range   Sodium 137 135 - 145 mmol/L   Potassium 4.0 3.5 - 5.1 mmol/L   Chloride 101 98 - 111 mmol/L   CO2 28 22 - 32 mmol/L   Glucose, Bld 125 (H) 70 - 99 mg/dL    Comment: Glucose reference range applies only to samples taken after fasting for at least 8 hours.   BUN 12 6 - 20 mg/dL   Creatinine, Ser 6.21 0.61 - 1.24 mg/dL   Calcium 9.7 8.9 - 30.8 mg/dL   Total Protein 7.1 6.5 - 8.1 g/dL   Albumin 4.4 3.5 - 5.0 g/dL   AST 21 15 - 41 U/L   ALT 13 0 - 44 U/L   Alkaline Phosphatase 83 38 - 126 U/L   Total Bilirubin 1.3 (H) 0.0 - 1.2 mg/dL   GFR, Estimated >65 >78 mL/min    Comment: (NOTE) Calculated using the CKD-EPI Creatinine Equation (2021)    Anion gap 8 5 - 15    Comment: Performed at [facility], [address], [place], [place] [address]  Hemoglobin A1c     Status: Abnormal   Collection Time: 05/03/23  6:37 PM  Result Value Ref Range   Hgb A1c MFr Bld 4.5 (L) 4.8 - 5.6 %    Comment: (NOTE) Pre diabetes:          5.7%-6.4%  Diabetes:              >6.4%  Glycemic control for   <7.0% adults with diabetes    Mean Plasma Glucose 82.45 mg/dL    Comment: Performed at [facility], [address] [place], [place] [address]  Lipid panel     Status: None   Collection Time: 05/03/23  6:37 PM  Result Value Ref Range   Cholesterol 119 0 - 200 mg/dL   Triglycerides 72 <132 mg/dL   HDL 51 >44 mg/dL   Total CHOL/HDL Ratio 2.3 RATIO   VLDL 14 0 - 40 mg/dL   LDL Cholesterol 54 0 - 99 mg/dL    Comment:        Total Cholesterol/HDL:CHD Risk Coronary Heart Disease Risk Table                     Men   Women  1/2 Average Risk   3.4   3.3  Average Risk       5.0   4.4  2 X Average  Risk   9.6   7.1  3 X Average Risk  23.4   11.0        Use the calculated Patient Ratio above and the CHD Risk Table to determine the patient's CHD Risk.        ATP III CLASSIFICATION (LDL):  <100     mg/dL   Optimal  161-096  mg/dL   Near or Above                    Optimal  130-159  mg/dL   Borderline  045-409  mg/dL   High  >811     mg/dL   Very High Performed at [facility], [address], [place], [place] [address]   Ethanol     Status: None   Collection Time: 05/03/23  6:37 PM  Result Value Ref Range   Alcohol, Ethyl (B) <10 <10 mg/dL    Comment: (NOTE) For medical purposes only. Performed at [facility], [address], [place], [place] [address]   TSH     Status: None   Collection Time: 05/03/23  6:37 PM  Result Value Ref Range   TSH 2.034 0.350 - 4.500 uIU/mL    Comment: Performed by a 3rd Generation assay with a functional sensitivity of <=0.01 uIU/mL. Performed at [facility], [address], [place], [place] [address]   POCT Urine Drug Screen - (I-Screen)     Status: Normal   Collection Time: 05/03/23  6:41 PM  Result Value Ref Range   POC Amphetamine UR None Detected    POC Secobarbital (BAR) None Detected    POC Buprenorphine (BUP) None Detected    POC Oxazepam (BZO) None Detected    POC Cocaine UR None Detected    POC Methamphetamine UR None Detected    POC Morphine None Detected    POC Methadone UR None Detected    POC Oxycodone UR None Detected    POC Marijuana UR None Detected     Blood Alcohol level:  Lab Results  Component Value Date   ETH <10 05/03/2023   ETH <10 03/07/2023    Metabolic Disorder Labs: Lab Results  Component Value Date   HGBA1C 4.5 (L) 05/03/2023   MPG 82.45 05/03/2023   No results found for: "PROLACTIN" Lab Results  Component Value Date   CHOL 119 05/03/2023   TRIG 72 05/03/2023   HDL 51 05/03/2023   CHOLHDL 2.3 05/03/2023   VLDL 14 05/03/2023   LDLCALC 54 05/03/2023    Physical  Findings: AIMS:  , ,  ,  ,    CIWA:    COWS:     Musculoskeletal: Strength & Muscle Tone: within normal limits Gait & Station: normal Patient leans: N/A  Psychiatric Specialty Exam:  Presentation  General Appearance:  Appropriate for Environment  Eye Contact: Fair  Speech: Clear and Coherent  Speech Volume: Normal  Handedness: Right   Mood and Affect  Mood: Depressed  Affect: Depressed   Thought Process  Thought Processes: Coherent  Descriptions of Associations:Circumstantial  Orientation:Full (Time, Place and Person)  Thought Content:WDL  History of Schizophrenia/Schizoaffective disorder:No  Duration of Psychotic Symptoms:No data recorded Hallucinations:Hallucinations: None  Ideas of Reference:None  Suicidal Thoughts:Suicidal Thoughts: No  Homicidal Thoughts:Homicidal Thoughts: No   Sensorium  Memory: Immediate Fair; Recent Fair; Remote Fair  Judgment: Fair  Insight: Fair   Chartered certified accountant: Fair  Attention Span: Fair  Recall: [employer] of Knowledge: Fair  Language: Fair   Psychomotor  Activity  Psychomotor Activity: Psychomotor Activity: Normal   Assets  Assets: Communication Skills; Desire for Improvement; Social Support; Physical Health   Sleep  Sleep: Sleep: Good Number of Hours of Sleep: 9    Physical Exam: Physical Exam Vitals and nursing note reviewed.  Constitutional:      Appearance: Normal appearance. He is normal weight.  HENT:     Head: Normocephalic and atraumatic.     Right Ear: Tympanic membrane normal.     Left Ear: Tympanic membrane normal.     Nose: Nose normal.     Mouth/Throat:     Mouth: Mucous membranes are moist.  Eyes:     Extraocular Movements: Extraocular movements intact.     Pupils: Pupils are equal, round, and reactive to light.  Cardiovascular:     Rate and Rhythm: Normal rate.     Pulses: Normal pulses.  Pulmonary:     Effort: Pulmonary effort is  normal.  Musculoskeletal:        General: Normal range of motion.     Cervical back: Normal range of motion and neck supple.  Neurological:     General: No focal deficit present.     Mental Status: He is alert and oriented to person, place, and time.  Psychiatric:        Behavior: Behavior normal.    Review of Systems  Constitutional: Negative.   HENT: Negative.    Eyes: Negative.   Respiratory: Negative.    Cardiovascular: Negative.   Gastrointestinal: Negative.   Genitourinary: Negative.   Musculoskeletal: Negative.   Skin: Negative.   Neurological: Negative.   Endo/Heme/Allergies: Negative.   Psychiatric/Behavioral:  Positive for depression. The patient is nervous/anxious.    Blood pressure (!) 99/56, pulse 74, temperature 97.9 F (36.6 C), resp. rate 16, height 5\' 9"  (1.753 m), weight 56.2 kg, SpO2 97%. Body mass index is 18.31 kg/m.   Treatment Plan Summary: Daily contact with patient to assess and evaluate symptoms and progress in treatment and Medication management  Elston Halsted, NP 05/05/2023, 4:25 PM

## 2023-05-05 NOTE — B[facility] Counselor (Signed)
 Adult Comprehensive Assessment  Patient ID: [name], adult   DOB: 09-21-2005, 18 y.o.   MRN: [id number]  Information Source: Information source: Patient  Current Stressors:  Patient states their primary concerns and needs for treatment are:: "Depression." Patient states their goals for this hospitilization and ongoing recovery are:: "To get rid of my depression." Educational / Learning stressors: Pt is on financial probation due to failing his first semester. Employment / Job issues: None reported Family Relationships: None reported Surveyor, quantity / Lack of resources (include bankruptcy): None reported Housing / Lack of housing: None reported Physical health (include injuries & life threatening diseases): None reported Social relationships: None reported Substance abuse: Pt denied any substance abuse. Bereavement / Loss: None reported.  Living/Environment/Situation:  Living Arrangements: Other relatives Living conditions (as described by patient or guardian): "Staying with granddad so I can have my own room." Who else lives in the home?: Pt lives with his grandfather. How long has patient lived in current situation?: "Since late February, late March." What is atmosphere in current home: Comfortable  Family History:  Marital status: Single Are you sexually active?: No What is your sexual orientation?: "Straight." Has your sexual activity been affected by drugs, alcohol, medication, or emotional stress?: N/A Does patient have children?: No  Childhood History:  By whom was/is the patient raised?: Mother Additional childhood history information: "It was pretty fun, nothing out the ordinary." Pt shared that he was raised by his mother in [place] for a bit but most of life has been in [place]. Description of patient's relationship with caregiver when they were a child: "Good." Patient's description of current relationship with people who raised him/her: "Still good." How were you  disciplined when you got in trouble as a child/adolescent?: "She'll talk to me or if I did something that I knew I wasn't supposed to do then she might whoop me or whatever." Does patient have siblings?: Yes Number of Siblings: 2 (Pt is the middle child.) Description of patient's current relationship with siblings: "The older one I don't talk to anymore. Younger one I talk to almost every day." Did patient suffer any verbal/emotional/physical/sexual abuse as a child?: No Did patient suffer from severe childhood neglect?: No Has patient ever been sexually abused/assaulted/raped as an adolescent or adult?: No Was the patient ever a victim of a crime or a disaster?: No Witnessed domestic violence?: No Has patient been affected by domestic violence as an adult?: No  Education:  Highest grade of school patient has completed: High school graduate. In his first year of college at [employer]. Currently a student?: Yes Name of school: [employer] How long has the patient attended?: "Since last August, of 2024." Learning disability?: No  Employment/Work Situation:   Employment Situation: Surveyor, minerals Job has Been Impacted by Current Illness: No What is the Longest Time Patient has Held a Job?: N/A Where was the Patient Employed at that Time?: N/A Has Patient ever Been in the [employer]?: No  Financial Resources:   Surveyor, quantity resources: Support from parents / caregiver, Medicaid  Alcohol/Substance Abuse:   What has been your use of drugs/alcohol within the last 12 months?: Pt denied any substance use. If attempted suicide, did drugs/alcohol play a role in this?: No Alcohol/Substance Abuse Treatment Hx: Denies past history If yes, describe treatment: N/A Has alcohol/substance abuse ever caused legal problems?: No  Social Support System:   Patient's Community Support System: Fair Development worker, community Support System: "Mainly my friends." Type of faith/religion: Pt denied any particular  faith/religion. How  does patient's faith help to cope with current illness?: N/A  Leisure/Recreation:   Do You Have Hobbies?: Yes Leisure and Hobbies: "Walk around outside or play my game."  Strengths/Needs:   What is the patient's perception of their strengths?: "I don't know." Patient states they can use these personal strengths during their treatment to contribute to their recovery: N/A Patient states these barriers may affect/interfere with their treatment: Pt denied any barriers Patient states these barriers may affect their return to the community: Pt denied any barriers. Other important information patient would like considered in planning for their treatment: N/A  Discharge Plan:   Currently receiving community mental health services: Yes (From Whom) (Counseling through [facility]) Patient states concerns and preferences for aftercare planning are: Pt open to referral for medication management. Patient states they will know when they are safe and ready for discharge when: "I think I'm good." Does patient have access to transportation?: Yes Does patient have financial barriers related to discharge medications?: No Patient description of barriers related to discharge medications: N/A Will patient be returning to same living situation after discharge?: Yes  Summary/Recommendations:   Summary and Recommendations (to be completed by the evaluator): Patient is a 18 year old,  single male from [place], [place] [facility]). He shared his primary concern for admission as "depression." Pt stated that his goal for hospitalization is "to get rid of" his depression. He reported that he is in his first year of college at [employer] but is on academic/financial probation due to failing his first semester of school. He shared that he has been staying with his grandfather so that he can have his own room. Pt denied any history of trauma or abuse. He also denied any use of substances. Pt reported receiving  counseling services through school. He endorsed being open to having medication management appointment scheduled for discharge. Recommendations include crisis stabilization, therapeutic milieu, encourage group attendance and participation, medication management for mood stabilization, and development of comprehensive mental wellness plan.  Randolm Butte. 05/05/2023

## 2023-05-05 NOTE — Plan of Care (Signed)
   Problem: Education: Goal: Emotional status will improve Outcome: Progressing Goal: Mental status will improve Outcome: Progressing   Problem: Activity: Goal: Sleeping patterns will improve Outcome: Progressing

## 2023-05-05 NOTE — Progress Notes (Signed)
   05/05/23 1000  Psych Admission Type (Psych Patients Only)  Admission Status Voluntary  Psychosocial Assessment  Patient Complaints Depression  Eye Contact Fair  Facial Expression Flat  Affect Sad  Speech Logical/coherent  Interaction Minimal  Motor Activity Slow  Appearance/Hygiene Unremarkable  Behavior Characteristics Cooperative;Appropriate to situation  Mood Depressed  Thought Process  Coherency WDL  Content WDL  Delusions None reported or observed  Perception WDL  Hallucination None reported or observed  Judgment WDL  Confusion None  Danger to Self  Current suicidal ideation? Denies  Self-Injurious Behavior No self-injurious ideation or behavior indicators observed or expressed   Agreement Not to Harm Self Yes  Description of Agreement verbal  Danger to Others  Danger to Others None reported or observed

## 2023-05-05 NOTE — BH IP Treatment Plan (Signed)
 Interdisciplinary Treatment and Diagnostic Plan Update  05/05/2023 Time of Session: 12:03 [name] MRN: [id number]  Principal Diagnosis: MDD (major depressive disorder), recurrent episode, with atypical features (HCC)  Secondary Diagnoses: Principal Problem:   MDD (major depressive disorder), recurrent episode, with atypical features (HCC)   Current Medications:  Current Facility-Administered Medications  Medication Dose Route Frequency Provider Last Rate Last Admin   acetaminophen  (TYLENOL ) tablet 650 mg  650 mg Oral Q6H PRN Arloa Suzen RAMAN, NP       alum & mag hydroxide-simeth (MAALOX/MYLANTA) 200-200-20 MG/5ML suspension 30 mL  30 mL Oral Q4H PRN Arloa Suzen RAMAN, NP       haloperidol  (HALDOL ) tablet 5 mg  5 mg Oral TID PRN Arloa Suzen RAMAN, NP       And   diphenhydrAMINE  (BENADRYL ) capsule 50 mg  50 mg Oral TID PRN Arloa Suzen RAMAN, NP       haloperidol  lactate (HALDOL ) injection 5 mg  5 mg Intramuscular TID PRN Arloa Suzen RAMAN, NP       And   diphenhydrAMINE  (BENADRYL ) injection 50 mg  50 mg Intramuscular TID PRN Arloa Suzen RAMAN, NP       And   LORazepam  (ATIVAN ) injection 2 mg  2 mg Intramuscular TID PRN Arloa Suzen RAMAN, NP       haloperidol  lactate (HALDOL ) injection 10 mg  10 mg Intramuscular TID PRN Arloa Suzen RAMAN, NP       And   diphenhydrAMINE  (BENADRYL ) injection 50 mg  50 mg Intramuscular TID PRN Arloa Suzen RAMAN, NP       And   LORazepam  (ATIVAN ) injection 2 mg  2 mg Intramuscular TID PRN Arloa Suzen RAMAN, NP       FLUoxetine  (PROZAC ) capsule 20 mg  20 mg Oral QHS Arloa Suzen RAMAN, NP   20 mg at 05/04/23 2103   hydrOXYzine  (ATARAX ) tablet 25 mg  25 mg Oral TID PRN Arloa Suzen RAMAN, NP   25 mg at 05/04/23 2103   magnesium  hydroxide (MILK OF MAGNESIA) suspension 30 mL  30 mL Oral Daily PRN Arloa Suzen RAMAN, NP       OLANZapine  zydis (ZYPREXA ) disintegrating tablet 5 mg  5 mg Oral BID Arloa Suzen RAMAN, NP   5 mg at 05/05/23 9088    traZODone  (DESYREL ) tablet 50 mg  50 mg Oral QHS PRN Arloa Suzen RAMAN, NP   50 mg at 05/04/23 2103   PTA Medications: Medications Prior to Admission  Medication Sig Dispense Refill Last Dose/Taking   FLUoxetine  (PROZAC ) 10 MG capsule Take 1 capsule (10 mg total) by mouth daily. 30 capsule 3    hydrOXYzine  (ATARAX ) 25 MG tablet Take 1 tablet (25 mg total) by mouth every 6 (six) hours. 12 tablet 0     Patient Stressors: Traumatic event    Patient Strengths: Ability for insight   Treatment Modalities: Medication Management, Group therapy, Case management,  1 to 1 session with clinician, Psychoeducation, Recreational therapy.   Physician Treatment Plan for Primary Diagnosis: MDD (major depressive disorder), recurrent episode, with atypical features (HCC) Long Term Goal(s): Improvement in symptoms so as ready for discharge   Short Term Goals: Ability to identify changes in lifestyle to reduce recurrence of condition will improve Ability to verbalize feelings will improve Ability to disclose and discuss suicidal ideas Ability to demonstrate self-control will improve Ability to identify and develop effective coping behaviors will improve Ability to maintain clinical measurements within normal limits will improve Compliance with prescribed medications  will improve  Medication Management: Evaluate patient's response, side effects, and tolerance of medication regimen.  Therapeutic Interventions: 1 to 1 sessions, Unit Group sessions and Medication administration.  Evaluation of Outcomes: Not Met  Physician Treatment Plan for Secondary Diagnosis: Principal Problem:   MDD (major depressive disorder), recurrent episode, with atypical features (HCC)  Long Term Goal(s): Improvement in symptoms so as ready for discharge   Short Term Goals: Ability to identify changes in lifestyle to reduce recurrence of condition will improve Ability to verbalize feelings will improve Ability to disclose  and discuss suicidal ideas Ability to demonstrate self-control will improve Ability to identify and develop effective coping behaviors will improve Ability to maintain clinical measurements within normal limits will improve Compliance with prescribed medications will improve     Medication Management: Evaluate patient's response, side effects, and tolerance of medication regimen.  Therapeutic Interventions: 1 to 1 sessions, Unit Group sessions and Medication administration.  Evaluation of Outcomes: Not Met   RN Treatment Plan for Primary Diagnosis: MDD (major depressive disorder), recurrent episode, with atypical features (HCC) Long Term Goal(s): Knowledge of disease and therapeutic regimen to maintain health will improve  Short Term Goals: Ability to remain free from injury will improve, Ability to verbalize frustration and anger appropriately will improve, Ability to demonstrate self-control, Ability to participate in decision making will improve, Ability to verbalize feelings will improve, Ability to disclose and discuss suicidal ideas, Ability to identify and develop effective coping behaviors will improve, and Compliance with prescribed medications will improve  Medication Management: RN will administer medications as ordered by provider, will assess and evaluate patient's response and provide education to patient for prescribed medication. RN will report any adverse and/or side effects to prescribing provider.  Therapeutic Interventions: 1 on 1 counseling sessions, Psychoeducation, Medication administration, Evaluate responses to treatment, Monitor vital signs and CBGs as ordered, Perform/monitor CIWA, COWS, AIMS and Fall Risk screenings as ordered, Perform wound care treatments as ordered.  Evaluation of Outcomes: Not Met   LCSW Treatment Plan for Primary Diagnosis: MDD (major depressive disorder), recurrent episode, with atypical features (HCC) Long Term Goal(s): Safe transition to  appropriate next level of care at discharge, Engage patient in therapeutic group addressing interpersonal concerns.  Short Term Goals: Engage patient in aftercare planning with referrals and resources, Increase social support, Increase ability to appropriately verbalize feelings, Increase emotional regulation, Facilitate acceptance of mental health diagnosis and concerns, Identify triggers associated with mental health/substance abuse issues, and Increase skills for wellness and recovery  Therapeutic Interventions: Assess for all discharge needs, 1 to 1 time with Social worker, Explore available resources and support systems, Assess for adequacy in community support network, Educate family and significant other(s) on suicide prevention, Complete Psychosocial Assessment, Interpersonal group therapy.  Evaluation of Outcomes: Not Met   Progress in Treatment: Attending groups: No. Participating in groups: No. Taking medication as prescribed: Yes. Toleration medication: Yes. Family/Significant other contact made: No, will contact:  mother, [name].  Patient understands diagnosis: Yes. Discussing patient identified problems/goals with staff: Yes. Medical problems stabilized or resolved: Yes. Denies suicidal/homicidal ideation: Yes. Issues/concerns per patient self-inventory: No. Other: none.   New problem(s) identified: No, Describe:  none identified.  New Short Term/Long Term Goal(s): medication management for mood stabilization; elimination of SI thoughts; development of comprehensive mental wellness plan.  Patient Goals:  To get rid of depression.   Discharge Plan or Barriers: CSW will assist pt with development of an appropriate aftercare/discharge plan.   Reason for Continuation of  Hospitalization: Depression Medication stabilization Suicidal ideation  Estimated Length of Stay: 1-7 days  Last 3 [place] Suicide Severity Risk Score: Flowsheet Row Admission (Current) from  05/04/20[facility] in [facility] INPAT[facility] ED from [facility]03/2023 in [facility]  C-SSRS RISK CATEGORY High Risk High Risk Low Risk       Last [facility] 2/9 Scores:     No data to display          Scribe for Treatment Team: Nadara JONELLE Fam, KEN 05/05/2023 2:28 PM

## 2023-05-05 NOTE — Progress Notes (Signed)
   05/05/23 1300  Spiritual Encounters  Type of Visit Attempt (pt unavailable)  Care provided to: Pt not available  Conversation partners present during encounter Social worker/Care management/TOC  Reason for visit Routine spiritual support  OnCall Visit No   Chaplain attempted to visit patient per suggestion of staff.  Patient's room was dark and door was closed so staff said he may have been sleeping.  Chaplain will return at the request of patient/staff.    Rev. Rana M. Nicholaus, M.Div. Chaplain Resident [facility]

## 2023-05-05 NOTE — [facility] Suicide Risk Assessment (Signed)
 [facility] INPATIENT:  Family/Significant Other Suicide Prevention Education  Suicide Prevention Education:  Education Completed; Tralicia Hochberg/mother [phone]), has been identified by the patient as the family member/significant other with whom the patient will be residing, and identified as the person(s) who will aid the patient in the event of a mental health crisis (suicidal ideations/suicide attempt).  With written consent from the patient, the family member/significant other has been provided the following suicide prevention education, prior to the and/or following the discharge of the patient.  The suicide prevention education provided includes the following: Suicide risk factors Suicide prevention and interventions National Suicide Hotline telephone number [facility] assessment telephone number [facility] Emergency Assistance 911 [facility] and/or Residential Mobile Crisis Unit telephone number  Request made of family/significant other to: Remove weapons (e.g., guns, rifles, knives), all items previously/currently identified as safety concern.   Remove drugs/medications (over-the-counter, prescriptions, illicit drugs), all items previously/currently identified as a safety concern.  The family member/significant other verbalizes understanding of the suicide prevention education information provided.  The family member/significant other agrees to remove the items of safety concern listed above.  Mother shared that she is not quite sure what brought pt into the hospital. She stated that she knew something was wrong with him but pt would not open up with her about what was going on with him. She shared that she had heard that he told someone that he doesn't know what his worth is here. However, Faul denied feeling that pt is a danger to himself or someone else. She also denied pt having access to any weapons. Held inquired about resources for her to help her with  communicating with pt. Kellis and CSW discussed family briefly and was informed that these will be added to pt discharge packet.   Randolm Butte 05/05/2023, 3:12 PM

## 2023-05-05 NOTE — Group Note (Signed)
 Recreation Therapy Group Note   Group Topic:Other  Group Date: 05/05/2023 Start Time: 1005 End Time: 1050 Facilitators: Deatrice Factor, LRT, CTRS Location:  Craft Room  Activity Description/Intervention: Therapeutic Drumming. Patients with peers and staff were given the opportunity to engage in a leader facilitated [facility] Group Empowerment Drumming Circle with staff from the [employer], in partnership with [employer]. Teaching laboratory technician and trained [employer], Kathlyne Parchment leading with LRT observing and documenting intervention and pt response. This evidenced-based practice targets 7 areas of health and wellbeing in the human experience including: stress-reduction, exercise, self-expression, camaraderie/support, nurturing, spirituality, and music-making (leisure).    Goal Area(s) Addresses:  Patient will engage in pro-social way in music group.  Patient will follow directions of drum leader on the first prompt. Patient will demonstrate no behavioral issues during group.  Patient will identify if a reduction in stress level occurs as a result of participation in therapeutic drum circle.     Affect/Mood: N/A   Participation Level: Did not attend    Clinical Observations/Individualized Feedback: Patient did not attend group.   Plan: Continue to engage patient in RT group sessions 2-3x/week.   Deatrice Factor, LRT, CTRS 05/05/2023 1:37 PM

## 2023-05-05 NOTE — Plan of Care (Signed)
   Problem: Education: Goal: Emotional status will improve Outcome: Progressing Goal: Mental status will improve Outcome: Progressing Goal: Verbalization of understanding the information provided will improve Outcome: Progressing   Problem: Activity: Goal: Interest or engagement in activities will improve Outcome: Progressing Goal: Sleeping patterns will improve Outcome: Progressing   Problem: Coping: Goal: Ability to verbalize frustrations and anger appropriately will improve Outcome: Progressing   Problem: Safety: Goal: Periods of time without injury will increase Outcome: Progressing

## 2023-05-06 DIAGNOSIS — F339 Major depressive disorder, recurrent, unspecified: Secondary | ICD-10-CM

## 2023-05-06 MED ORDER — OLANZAPINE 5 MG PO TBDP
5.0000 mg | ORAL_TABLET | Freq: Every day | ORAL | Status: DC
  Administered 2023-05-07 – 2023-05-08 (×2): 5 mg via ORAL
  Filled 2023-05-06 (×2): qty 1

## 2023-05-06 NOTE — Group Note (Signed)
 Date:  05/06/2023 Time:  9:17 PM  Group Topic/Focus:  Wrap-Up Group:   The focus of this group is to help patients review their daily goal of treatment and discuss progress on daily workbooks.    Participation Level:  Active  Participation Quality:  Appropriate  Affect:  Appropriate  Cognitive:  Alert and Appropriate  Insight: Good  Engagement in Group:  Limited  Modes of Intervention:  Discussion and Orientation  Additional Comments:     Maglione,Hydie Langan E 05/06/2023, 9:17 PM

## 2023-05-06 NOTE — Progress Notes (Signed)
 [facility] MD Progress Note  05/06/2023 10:04 AM [name]  MRN:  [id number]  Subjective:  [name] is seen today face to face for follow up.  He states he is feeling "groggy" and that he is experiencing sadness.  He denies suicidal ideation and homicidal ideation.  Patient denies auditory and visual hallucinations.  Patient is sleeping well and is eating.  He reports he was talking regularly to a counselor at [facility] prior to admission.  Patient states he is in the hospital due to an "overuse of hydroxyzine " but denies that this was a suicide attempt.  He states that he "just wanted the pain to stop" and clarified he was referring to the emotional pain / sadness.    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   History of acute respiratory failure 12/2004   in setting RSV and reactive airway disease//  respiratory failure as newborn born at 30 wks   History of pneumonia 03/02/2005   w/ right otitis media and thrush   Hypertropia of right eye    Immunizations up to date    Ocular torticollis    both eyes   Third nerve palsy     Past Surgical History:  Procedure Laterality Date   MRI  12/18/2006   SEDATION   MUSCLE RECESSION AND RESECTION Bilateral 09/16/2015   Procedure: LEFT SUPERIOR OBLIQUE TENDON EXPANDER ,  LEFT INFERIOR OBLIQUE RESECTION/ ADVANCEMENT, RIGHT INFERIOR OBLIQUE RECESSION;  Surgeon: Lorena Rolling, MD;  Location: [facility];  Service: Ophthalmology;  Laterality: Bilateral;   NASOLACRIMAL DUCT  PROBING Bilateral 07/27/2007   and Left superior oblique nasal tenectomy/  Left inferior & lateral rectus muscule   REMOVAL LEFT EAR CYST  age 18 or 4   SUPERIOR OBLIQUE TENOTOMY Right 01/02/2009   REPEAT FOR RESIDUAL BROWN SYNDROME   TRANSTHORACIC ECHOCARDIOGRAM  04/07/2004   normal cardiac structure and function w/ no evidence aortic stenosis (mean transaortic vavle grandient 2.55mmHg),  ef 55-60%   Family History:  Family History  Problem Relation Age of Onset   Cancer Other    Family Psychiatric  History: see chart Social History:  Social History   Substance and Sexual Activity  Alcohol Use Never     Social History   Substance and Sexual Activity  Drug Use Never    Social History   Socioeconomic History   Marital status: Single    Spouse name: Not on file   Number of children: Not on file   Years of education: Not on file   Highest education level: Not on file  Occupational History   Not on file  Tobacco Use   Smoking status: Never   Smokeless tobacco: Never  Vaping Use   Vaping status: Never Used  Substance and Sexual Activity   Alcohol use: Never   Drug use: Never   Sexual activity: Never  Other Topics Concern   Not on file  Social History Narrative   BORN AT 30 WKS  In NICU FOR 1 MONTH FOR RESPIRATORY/  GI FAILURE.        NO PT/  FAMILY ANESTHESIA PROBLEMS.      NO SMOKER IN HOME.      LIVES W/ MOTHER.  NO CUSTODY ISSUES.      6 TH GRADE AT [employer]    Social Drivers of Health   Financial Resource Strain: Not at Risk (03/07/2022)   Received from [employer]    Financial Resource Strain: 1  Food Insecurity: No Food Insecurity (05/04/2023)   Hunger Vital Sign    Worried About Running Out of Food in the Last Year: Never true    Ran Out of Food in the Last Year: Never true  Transportation Needs: No Transportation Needs (05/04/2023)   PRAPARE - Administrator, Civil Service (Medical): No    Lack of  Transportation (Non-Medical): No  Physical Activity: Not on File (02/22/2022)   Received from [facility]   Physical Activity    Physical Activity: 0  Stress: Not on File (02/22/2022)   Received from [facility]   Stress    Stress: 0  Social Connections: Not on File (09/27/2022)   Received from [facility]   Social Connections    Connectedness: 0   Additional Social History:                         Sleep: Good  Appetite:  Good  Current Medications: Current Facility-Administered Medications  Medication Dose Route Frequency Provider Last Rate Last Admin   acetaminophen  (TYLENOL ) tablet 650 mg  650 mg Oral Q6H PRN Buena Carmine, NP       alum & mag hydroxide-simeth (MAALOX/MYLANTA) 200-200-20 MG/5ML suspension 30 mL  30 mL Oral Q4H PRN Buena Carmine, NP       haloperidol  (HALDOL ) tablet 5 mg  5 mg Oral TID PRN Buena Carmine, NP       And   diphenhydrAMINE  (BENADRYL ) capsule 50 mg  50 mg Oral TID PRN Buena Carmine, NP       haloperidol  lactate (HALDOL ) injection 5 mg  5 mg Intramuscular TID PRN Buena Carmine, NP       And   diphenhydrAMINE  (BENADRYL ) injection 50 mg  50 mg Intramuscular TID PRN Buena Carmine, NP       And   LORazepam  (ATIVAN ) injection 2 mg  2 mg Intramuscular TID PRN Buena Carmine, NP       haloperidol  lactate (HALDOL ) injection 10 mg  10 mg Intramuscular TID PRN Buena Carmine, NP       And   diphenhydrAMINE  (BENADRYL ) injection 50 mg  50 mg Intramuscular TID PRN Buena Carmine, NP       And   LORazepam  (ATIVAN ) injection 2 mg  2 mg Intramuscular TID PRN Buena Carmine, NP       FLUoxetine  (PROZAC ) capsule 20 mg  20 mg Oral QHS Buena Carmine, NP   20 mg at 05/04/23 2103   hydrOXYzine  (ATARAX ) tablet 25 mg  25 mg Oral TID PRN Buena Carmine, NP   25 mg at 05/04/23 2103   magnesium  hydroxide (MILK OF MAGNESIA) suspension 30 mL  30 mL Oral Daily PRN Buena Carmine, NP       OLANZapine  zydis (ZYPREXA ) disintegrating  tablet 5 mg  5 mg Oral BID Buena Carmine, NP   5 mg at 05/06/23 0901   traZODone  (DESYREL ) tablet 50  mg  50 mg Oral QHS PRN Buena Carmine, NP   50 mg at 05/04/23 2103    Lab Results: No results found for this or any previous visit (from the past 48 hours).  Blood Alcohol level:  Lab Results  Component Value Date   ETH <10 05/03/2023   ETH <10 03/07/2023    Metabolic Disorder Labs: Lab Results  Component Value Date   HGBA1C 4.5 (L) 05/03/2023   MPG 82.45 05/03/2023   No results found for: "PROLACTIN" Lab Results  Component Value Date   CHOL 119 05/03/2023   TRIG 72 05/03/2023   HDL 51 05/03/2023   CHOLHDL 2.3 05/03/2023   VLDL 14 05/03/2023   LDLCALC 54 05/03/2023    Physical Findings: AIMS:  , ,  ,  ,    CIWA:    COWS:     Musculoskeletal: Strength & Muscle Tone: within normal limits Gait & Station: normal Patient leans: N/A  Psychiatric Specialty Exam:  Presentation  General Appearance:  Appropriate for Environment  Eye Contact: Fair  Speech: Clear and Coherent  Speech Volume: Normal  Handedness: Right   Mood and Affect  Mood: Depressed  Affect: Depressed   Thought Process  Thought Processes: Coherent  Descriptions of Associations:Circumstantial  Orientation:Full (Time, Place and Person)  Thought Content:WDL  History of Schizophrenia/Schizoaffective disorder:No  Duration of Psychotic Symptoms:No data recorded Hallucinations:Hallucinations: None  Ideas of Reference:None  Suicidal Thoughts:Suicidal Thoughts: No  Homicidal Thoughts:Homicidal Thoughts: No   Sensorium  Memory: Immediate Fair; Recent Fair; Remote Fair  Judgment: Fair  Insight: Fair   Art therapist  Concentration: Fair  Attention Span: Fair  Recall: [employer] of Knowledge: Fair  Language: Fair   Psychomotor Activity  Psychomotor Activity: Psychomotor Activity: Normal   Assets  Assets: Communication Skills; Desire for  Improvement; Social Support; Physical Health   Sleep  Sleep: Sleep: Good Number of Hours of Sleep: 9    Physical Exam: Physical Exam Vitals and nursing note reviewed.  Eyes:     Pupils: Pupils are equal, round, and reactive to light.  Pulmonary:     Effort: Pulmonary effort is normal.  Skin:    General: Skin is dry.  Neurological:     Mental Status: He is alert and oriented to person, place, and time.  Psychiatric:        Attention and Perception: Attention and perception normal.        Mood and Affect: Mood is depressed. Affect is flat.        Speech: Speech normal.        Behavior: Behavior normal. Behavior is cooperative.        Thought Content: Thought content normal.        Cognition and Memory: Cognition and memory normal.    Review of Systems  Psychiatric/Behavioral:  Positive for depression.   All other systems reviewed and are negative.  Blood pressure (!) 109/50, pulse (!) 57, temperature 97.9 F (36.6 C), resp. rate 16, height 5\' 9"  (1.753 m), weight 56.2 kg, SpO2 100%. Body mass index is 18.31 kg/m.   Treatment Plan Summary: Daily contact with patient to assess and evaluate symptoms and progress in treatment and Medication management  Plan to reduce zyprexa  to 5mg  PO Q HS due to complaint of daytime sleepiness  Jeraline Moment, NP 05/06/2023, 10:04 AM

## 2023-05-06 NOTE — Plan of Care (Signed)
  Problem: Education: Goal: Mental status will improve Outcome: Not Progressing   Problem: Activity: Goal: Interest or engagement in activities will improve Outcome: Progressing Goal: Sleeping patterns will improve Outcome: Progressing   Problem: Coping: Goal: Ability to verbalize frustrations and anger appropriately will improve Outcome: Progressing Goal: Ability to demonstrate self-control will improve Outcome: Progressing

## 2023-05-07 DIAGNOSIS — F339 Major depressive disorder, recurrent, unspecified: Secondary | ICD-10-CM | POA: Diagnosis not present

## 2023-05-07 NOTE — Group Note (Signed)
 Date:  05/07/2023 Time:  9:30 PM  Group Topic/Focus:  Wrap-Up Group:   The focus of this group is to help patients review their daily goal of treatment and discuss progress on daily workbooks.    Participation Level:  Active  Participation Quality:  Appropriate  Affect:  Appropriate  Cognitive:  Alert  Insight: Limited  Engagement in Group:  Limited  Modes of Intervention:  Activity and Support  Additional Comments:     Maglione,Lenorris Karger E 05/07/2023, 9:30 PM

## 2023-05-07 NOTE — Plan of Care (Signed)
   Problem: Education: Goal: Emotional status will improve Outcome: Progressing   Problem: Education: Goal: Mental status will improve Outcome: Progressing   Problem: Activity: Goal: Interest or engagement in activities will improve Outcome: Progressing

## 2023-05-07 NOTE — Progress Notes (Addendum)
 [facility] MD Progress Note  05/07/2023 8:45 AM [name]  MRN:  [id number]  Subjective:  Patient states "I am doing ok today."  Evaluation: [name] is a 18 year old african [place] male who initially presented with symptoms of depression, suicidal thoughts and an "overuse of hydroxyzine ."  He is seen today face to face for follow up.  He states he is feeling rested and not as "groggy" as yesterday.  He denies suicidal ideation and homicidal ideation.  Patient denies auditory and visual hallucinations.  Patient is sleeping well and is eating.    Patient expresses a desire to discharge.  He states his feelings of depression are much improved.  He has communicated with his Mom while inpatient and it was "a smooth conversation."  Patient is encouraged to attend groups and work with CSW to set up outpatient follow up.   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   History of acute respiratory failure 12/2004   in setting RSV and reactive airway disease//  respiratory failure as newborn born at 30 wks   History of pneumonia 03/02/2005   w/ right otitis media and thrush   Hypertropia of right eye    Immunizations up to date    Ocular torticollis    both eyes   Third nerve palsy     Past Surgical History:  Procedure Laterality Date   MRI  12/18/2006   SEDATION   MUSCLE RECESSION AND RESECTION Bilateral 09/16/2015   Procedure: LEFT SUPERIOR OBLIQUE TENDON EXPANDER ,  LEFT INFERIOR OBLIQUE RESECTION/ ADVANCEMENT, RIGHT  INFERIOR OBLIQUE RECESSION;  Surgeon: Lorena Rolling, MD;  Location: [facility];  Service: Ophthalmology;  Laterality: Bilateral;   NASOLACRIMAL DUCT PROBING Bilateral 07/27/2007   and Left superior oblique nasal tenectomy/  Left inferior & lateral rectus muscule   REMOVAL LEFT EAR CYST  age 41 or 4   SUPERIOR OBLIQUE TENOTOMY Right 01/02/2009   REPEAT FOR RESIDUAL BROWN SYNDROME   TRANSTHORACIC ECHOCARDIOGRAM  04/07/2004   normal cardiac structure and function w/ no evidence aortic stenosis (mean transaortic vavle grandient 2.74mmHg),  ef 55-60%   Family History:  Family History  Problem Relation Age of Onset   Cancer Other    Family Psychiatric  History: see chart Social History:  Social History   Substance and Sexual Activity  Alcohol Use Never     Social History   Substance and Sexual Activity  Drug Use Never    Social History   Socioeconomic History   Marital status: Single    Spouse name: Not on file   Number of children: Not on file   Years of education: Not on file   Highest education level: Not on file  Occupational History   Not on file  Tobacco Use   Smoking status: Never   Smokeless tobacco: Never  Vaping Use   Vaping status: Never Used  Substance and Sexual Activity   Alcohol use: Never   Drug use: Never   Sexual activity: Never  Other Topics  Concern   Not on file  Social History Narrative   BORN AT 48 WKS  In NICU FOR 1 MONTH FOR RESPIRATORY/ GI FAILURE.        NO PT/  FAMILY ANESTHESIA PROBLEMS.      NO SMOKER IN HOME.      LIVES W/ MOTHER.  NO CUSTODY ISSUES.      6 TH GRADE AT [employer]    Social Drivers of Health   Financial Resource Strain: Not at Risk (03/07/2022)   Received from [employer] [facility]    Financial Resource Strain: 1  Food Insecurity: No Food Insecurity (05/04/2023)   Hunger Vital Sign    Worried About Running Out of Food in the Last Year: Never true    Ran Out of Food in  the Last Year: Never true  Transportation Needs: No Transportation Needs (05/04/2023)   PRAPARE - Administrator, Civil Service (Medical): No    Lack of Transportation (Non-Medical): No  Physical Activity: Not on File (02/22/2022)   Received from [facility]   Physical Activity    Physical Activity: 0  Stress: Not on File (02/22/2022)   Received from [facility]   Stress    Stress: 0  Social Connections: Not on File (09/27/2022)   Received from [facility]   Social Connections    Connectedness: 0   Additional Social History:                         Sleep: Good  Appetite:  Good  Current Medications: Current Facility-Administered Medications  Medication Dose Route Frequency Provider Last Rate Last Admin   acetaminophen  (TYLENOL ) tablet 650 mg  650 mg Oral Q6H PRN Buena Carmine, NP       alum & mag hydroxide-simeth (MAALOX/MYLANTA) 200-200-20 MG/5ML suspension 30 mL  30 mL Oral Q4H PRN Buena Carmine, NP       haloperidol  (HALDOL ) tablet 5 mg  5 mg Oral TID PRN Buena Carmine, NP       And   diphenhydrAMINE  (BENADRYL ) capsule 50 mg  50 mg Oral TID PRN Buena Carmine, NP       haloperidol  lactate (HALDOL ) injection 5 mg  5 mg Intramuscular TID PRN Buena Carmine, NP       And   diphenhydrAMINE  (BENADRYL ) injection 50 mg  50 mg Intramuscular TID PRN Buena Carmine, NP       And   LORazepam  (ATIVAN ) injection 2 mg  2 mg Intramuscular TID PRN Buena Carmine, NP       haloperidol  lactate (HALDOL ) injection 10 mg  10 mg Intramuscular TID PRN Buena Carmine, NP       And   diphenhydrAMINE  (BENADRYL ) injection 50 mg  50 mg Intramuscular TID PRN Buena Carmine, NP       And   LORazepam  (ATIVAN ) injection 2 mg  2 mg Intramuscular TID PRN Buena Carmine, NP       FLUoxetine  (PROZAC ) capsule 20 mg  20 mg Oral QHS Buena Carmine, NP   20 mg at 05/06/23 2100   hydrOXYzine  (ATARAX ) tablet 25 mg  25 mg Oral TID PRN Buena Carmine, NP   25 mg at  05/04/23 2103   magnesium  hydroxide (MILK OF MAGNESIA) suspension 30 mL  30 mL Oral Daily PRN Buena Carmine, NP       OLANZapine  zydis (ZYPREXA ) disintegrating tablet  5 mg  5 mg Oral QHS Cordera Stineman A, NP       traZODone  (DESYREL ) tablet 50 mg  50 mg Oral QHS PRN Buena Carmine, NP   50 mg at 05/04/23 2103    Lab Results: No results found for this or any previous visit (from the past 48 hours).  Blood Alcohol level:  Lab Results  Component Value Date   ETH <10 05/03/2023   ETH <10 03/07/2023    Metabolic Disorder Labs: Lab Results  Component Value Date   HGBA1C 4.5 (L) 05/03/2023   MPG 82.45 05/03/2023   No results found for: "PROLACTIN" Lab Results  Component Value Date   CHOL 119 05/03/2023   TRIG 72 05/03/2023   HDL 51 05/03/2023   CHOLHDL 2.3 05/03/2023   VLDL 14 05/03/2023   LDLCALC 54 05/03/2023    Physical Findings: AIMS:  , ,  ,  ,    CIWA:    COWS:     Musculoskeletal: Strength & Muscle Tone: within normal limits Gait & Station: normal Patient leans: N/A  Psychiatric Specialty Exam:  Presentation  General Appearance:  Appropriate for Environment  Eye Contact: Fair  Speech: Clear and Coherent  Speech Volume: Normal  Handedness: Right   Mood and Affect  Mood: Depressed  Affect: Depressed   Thought Process  Thought Processes: Coherent  Descriptions of Associations:Circumstantial  Orientation:Full (Time, Place and Person)  Thought Content:WDL  History of Schizophrenia/Schizoaffective disorder:No  Duration of Psychotic Symptoms:No data recorded Hallucinations:No data recorded  Ideas of Reference:None  Suicidal Thoughts:No data recorded  Homicidal Thoughts:No data recorded   Sensorium  Memory: Immediate Fair; Recent Fair; Remote Fair  Judgment: Fair  Insight: Fair   Art therapist  Concentration: Fair  Attention Span: Fair  Recall: [employer] of  Knowledge: Fair  Language: Fair   Psychomotor Activity  Psychomotor Activity: No data recorded   Assets  Assets: Communication Skills; Desire for Improvement; Social Support; Physical Health   Sleep  Sleep: No data recorded    Physical Exam: Physical Exam Vitals and nursing note reviewed.  Constitutional:      Appearance: Normal appearance.  Eyes:     Pupils: Pupils are equal, round, and reactive to light.  Pulmonary:     Effort: Pulmonary effort is normal.  Skin:    General: Skin is dry.  Neurological:     Mental Status: He is alert and oriented to person, place, and time.  Psychiatric:        Attention and Perception: Attention and perception normal.        Mood and Affect: Mood and affect normal.        Speech: Speech normal.        Behavior: Behavior normal. Behavior is cooperative.        Thought Content: Thought content normal.        Cognition and Memory: Cognition and memory normal.    Review of Systems  Psychiatric/Behavioral:  Positive for depression.   All other systems reviewed and are negative.  Blood pressure (!) 109/56, pulse 61, temperature 97.7 F (36.5 C), temperature source Oral, resp. rate 18, height 5\' 9"  (1.753 m), weight 56.2 kg, SpO2 99%. Body mass index is 18.31 kg/m.   Treatment Plan Summary: Daily contact with patient to assess and evaluate symptoms and progress in treatment and Medication management  Continue with current treatment plan on 05/07/2023 as listed below except were noted   Major depressive disorder   Continue prozac   20mg  PO Q HS Continue zyprexa  5mg  PO Q HS Continue hydroxyzine  25 mg p.o. 3 times daily as needed   CSW to continue working on discharge disposition Patient encouraged to participate in therapeutic milieu   Jeraline Moment, NP 05/07/2023, 8:45 AM

## 2023-05-07 NOTE — Plan of Care (Signed)
   Problem: Education: Goal: Knowledge of [facility] Education information/materials will improve Outcome: Progressing Goal: Emotional status will improve Outcome: Progressing Goal: Mental status will improve Outcome: Progressing Goal: Verbalization of understanding the information provided will improve Outcome: Progressing

## 2023-05-07 NOTE — Progress Notes (Signed)
 Patient did not complete questionnaire. Patient withdrawn, but calm and cooperative when interacting with staff. Patient denies SI/HI/AH/VH to this RN. Patient encouraged by NP to attend groups and attempt to interact with peers more as he has been withdrawn to his room while on the unit. Patient attempted to come out of room more today. Patient given PRN anxiety medication during today's shift.

## 2023-05-08 DIAGNOSIS — F339 Major depressive disorder, recurrent, unspecified: Principal | ICD-10-CM

## 2023-05-08 MED ORDER — FLUOXETINE HCL 20 MG PO CAPS
40.0000 mg | ORAL_CAPSULE | Freq: Every day | ORAL | Status: DC
  Administered 2023-05-08: 40 mg via ORAL
  Filled 2023-05-08: qty 2

## 2023-05-08 NOTE — Group Note (Signed)
 Recreation Therapy Group Note   Group Topic:General Recreation  Group Date: 05/08/2023 Start Time: 1500 End Time: 1600 Facilitators: Deatrice Factor, LRT, CTRS Location: Courtyard  Group Description: [employer]. LRT and patients played games of basketball, drew with chalk, and played corn hole while outside in the courtyard while getting fresh air and sunlight. Music was being played in the background. LRT and peers conversed about different games they have played before, what they do in their free time and anything else that is on their minds. LRT encouraged pts to drink water after being outside, sweating and getting their heart rate up.  Goal Area(s) Addressed: Patient will build on frustration tolerance skills. Patients will partake in a competitive play game with peers. Patients will gain knowledge of new leisure interest/hobby.    Affect/Mood: N/A   Participation Level: Did not attend    Clinical Observations/Individualized Feedback: Patient did not attend group.   Plan: Continue to engage patient in RT group sessions 2-3x/week.   Deatrice Factor, LRT, CTRS 05/08/2023 5:41 PM

## 2023-05-08 NOTE — Group Note (Signed)
 Date:  05/08/2023 Time:  10:48 PM  Group Topic/Focus:  Wrap-Up Group:   The focus of this group is to help patients review their daily goal of treatment and discuss progress on daily workbooks.    Participation Level:  Active  Participation Quality:  Appropriate  Affect:  Appropriate  Cognitive:  Appropriate  Insight: Appropriate  Engagement in Group:  Engaged  Modes of Intervention:  Discussion   [name] 05/08/2023, 10:48 PM

## 2023-05-08 NOTE — Group Note (Signed)
 Recreation Therapy Group Note   Group Topic:Healthy Support Systems  Group Date: 05/08/2023 Start Time: 1035 End Time: 1130 Facilitators: Yvonna Herder, CTRS Location: Craft Room  Group Description: Straw Bridge. In groups or individually, patients were given 10 plastic drinking straws and an equal length of masking tape. Using the materials provided, patients were instructed to build a free-standing bridge-like structure to suspend an everyday item (ex: deck of cards) off the floor or table surface. All materials were required to be used in Secondary school teacher. LRT facilitated post-activity discussion reviewing the importance of having strong and healthy support systems in our lives. LRT discussed how the people in our lives serve as the tape and the deck of cards we placed on top of our straw structure are the stressors we face in daily life. LRT and pts discussed what happens in our life when things get too heavy for us , and we don't have strong supports outside of the hospital. Pt shared 2 of their healthy supports in their life aloud in the group.   Goal Area(s) Addressed:  Patient will identify 2 healthy supports in their life. Patient will identify skills to successfully complete activity. Patient will identify correlation of this activity to life post-discharge.  Patient will build on frustration tolerance skills. Patient will increase team building and communication skills.    Affect/Mood: Flat   Participation Level: Minimal   Participation Quality: Independent   Behavior: Hesitant and On-looking   Speech/Thought Process: Coherent   Insight: Limited   Judgement: Fair    Modes of Intervention: Education, Exploration, Group work, and Support   Patient Response to Interventions:  Receptive   Education Outcome:  In group clarification offered    Clinical Observations/Individualized Feedback: [name] was minimally active in their participation of session activities and  group discussion. Pt identified "my friend and music" as healthy supports. Pt minimally interacted with LRT and peers while in group.    Plan: Continue to engage patient in RT group sessions 2-3x/week.   Deatrice Factor, LRT, CTRS 05/08/2023 11:38 AM

## 2023-05-08 NOTE — Plan of Care (Signed)
   Problem: Health Behavior/Discharge Planning: Goal: Compliance with treatment plan for underlying cause of condition will improve Outcome: Progressing   Problem: Safety: Goal: Periods of time without injury will increase Outcome: Progressing

## 2023-05-08 NOTE — Plan of Care (Signed)
  Problem: Education: Goal: Emotional status will improve Outcome: Progressing   Problem: Education: Goal: Mental status will improve Outcome: Progressing   Problem: Education: Goal: Verbalization of understanding the information provided will improve Outcome: Progressing   

## 2023-05-08 NOTE — Group Note (Signed)
 [facility] LCSW Group Therapy Note    Group Date: 05/08/2023 Start Time: 1300 End Time: 1400  Type of Therapy and Topic:  Group Therapy:  Overcoming Obstacles  Participation Level:  [facility] PARTICIPATION LEVEL: Did Not Attend   Description of Group:   In this group patients will be encouraged to explore what they see as obstacles to their own wellness and recovery. They will be guided to discuss their thoughts, feelings, and behaviors related to these obstacles. The group will process together ways to cope with barriers, with attention given to specific choices patients can make. Each patient will be challenged to identify changes they are motivated to make in order to overcome their obstacles. This group will be process-oriented, with patients participating in exploration of their own experiences as well as giving and receiving support and challenge from other group members.  Therapeutic Goals: 1. Patient will identify personal and current obstacles as they relate to admission. 2. Patient will identify barriers that currently interfere with their wellness or overcoming obstacles.  3. Patient will identify feelings, thought process and behaviors related to these barriers. 4. Patient will identify two changes they are willing to make to overcome these obstacles:    Summary of Patient Progress Patient did not attend group.   Therapeutic Modalities:   Cognitive Behavioral Therapy Solution Focused Therapy Motivational Interviewing Relapse Prevention Therapy   Randolm Butte, LCSW

## 2023-05-08 NOTE — Progress Notes (Signed)
 Patient states he slept fine last night and presents flat/depressed. Patient denies any questions/concerns other than asking "when am I getting discharged?" Patient denies SI,HI, and A/V/H with no plan or intent. Patient remains minimal to self and isolative. Patient is cooperative and calm at this time.

## 2023-05-08 NOTE — Progress Notes (Signed)
   05/07/23 2000  Psych Admission Type (Psych Patients Only)  Admission Status Voluntary  Psychosocial Assessment  Patient Complaints Depression  Eye Contact Fair  Facial Expression Flat  Affect Sad;Depressed  Speech Logical/coherent  Interaction Minimal  Motor Activity Slow  Appearance/Hygiene Improved  Behavior Characteristics Unwilling to participate  Mood Pleasant  Thought Process  Coherency WDL  Content Blaming self  Delusions None reported or observed  Perception WDL  Hallucination None reported or observed  Judgment WDL  Confusion WDL  Danger to Self  Current suicidal ideation? Denies  Self-Injurious Behavior No self-injurious ideation or behavior indicators observed or expressed   Agreement Not to Harm Self Yes  Danger to Others  Danger to Others None reported or observed   Patient denies SI/HI/AVH interacting appropriately with peers and staff, no distres noted, 15 miutes safety checks maintained .

## 2023-05-08 NOTE — Progress Notes (Signed)
 [facility] MD Progress Note  05/08/2023 4:32 PM [name]  MRN:  [id number]  [name] is  18 year-old male patient who  presented to [facility]  on 05/03/2023 for a psychiatric evaluation.  He presented  with history of depression with suicidal thoughts and was  accompanied by [facility] and counselor from [facility]. Patient was attending a weekly counseling session and revealed to his therapist that he had been taking extra doses of his hydroxyzine  to facilitate sleep when he was severely depressed.  He reported that  he was taking his fluoxetine  1 tablet/day as normal.  He has not established with a medication provider and has been taking doses of medication that he was able to get a refill from the emergency department on 03/07/2023.  Patient was admitted  to the [facility]  crisis center on 02/28/2023. Due to suicidal ideations with a plan of jumping off of a bridge.  During his admission he was started on fluoxetine  and hydroxyzine  and subsequently discharged with these medications.  Patient was instructed to follow-up here at the [facility] outpatient clinic but reported that he never was able to follow-up.   Subjective: Patient is seen for reassessment, affect appears to be depressed.  Eye contact is fleeting.  States that he was admitted for depression and anxiety, threatened to harm himself however he had no plan at the time.  States that he has been feeling depressed since the beginning of this year, dealing with a lot of stress and feeling overwhelmed secondary to school.  He is currently denying any depression or anxiety, stating that he is feeling better.  Denies SI/HI and AVH.  States that he lives at home with his grandfather, was previously staying with his mother.  According to nursing staff he does come out of his room to eat, otherwise he is isolated to the room.  The patient's mother, [name] [phone], she reports that she has been speaking with the patient, that she does have some concerns, is inquiring as  to how she can better help the patient with his depressive symptoms.  They discussed with her the importance of assessing his baseline, then we will further discuss recommendations.  States that at baseline the patient is usually " nonchalant" and does not care about anything.  Reports that she will be visiting the patient this evening during visiting hours.   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   History of acute respiratory failure 12/2004   in setting RSV and reactive airway disease//  respiratory failure as newborn born at 30 wks   History of pneumonia 03/02/2005   w/ right otitis media and thrush   Hypertropia of right eye    Immunizations up to date    Ocular torticollis    both eyes   Third nerve palsy     Past Surgical History:  Procedure Laterality Date   MRI  12/18/2006   SEDATION   MUSCLE RECESSION AND RESECTION Bilateral 09/16/2015   Procedure: LEFT SUPERIOR OBLIQUE TENDON EXPANDER ,  LEFT INFERIOR OBLIQUE RESECTION/ ADVANCEMENT, RIGHT INFERIOR OBLIQUE RECESSION;  Surgeon: Lorena Rolling, MD;  Location: [facility];  Service: Ophthalmology;  Laterality: Bilateral;   NASOLACRIMAL DUCT PROBING Bilateral 07/27/2007   and Left superior oblique nasal tenectomy/  Left inferior & lateral rectus muscule   REMOVAL LEFT EAR CYST  age 52 or 4   SUPERIOR OBLIQUE TENOTOMY Right 01/02/2009   REPEAT FOR RESIDUAL BROWN SYNDROME   TRANSTHORACIC ECHOCARDIOGRAM  04/07/2004   normal cardiac structure and  function w/ no evidence aortic stenosis (mean transaortic vavle grandient 2.30mmHg),  ef 55-60%   Family History:  Family History  Problem Relation Age of Onset   Cancer Other    Family Psychiatric  History: see chart Social History:  Social History   Substance and Sexual Activity  Alcohol Use Never     Social History   Substance and Sexual Activity  Drug Use Never    Social History   Socioeconomic History   Marital status: Single    Spouse name: Not on file   Number of children: Not on file   Years of education: Not on file   Highest education level: Not on file  Occupational History   Not on file  Tobacco Use   Smoking status: Never   Smokeless tobacco: Never  Vaping Use   Vaping status: Never Used  Substance and Sexual Activity   Alcohol use: Never   Drug use: Never   Sexual activity: Never  Other Topics Concern   Not on file  Social History Narrative   BORN AT 30 WKS  In NICU FOR 1 MONTH FOR RESPIRATORY/ GI FAILURE.        NO PT/  FAMILY ANESTHESIA PROBLEMS.      NO SMOKER IN HOME.      LIVES W/ MOTHER.  NO CUSTODY ISSUES.      6 TH GRADE AT [employer]    Social Drivers of Health   Financial Resource Strain: Not at Risk (03/07/2022)   Received from [employer]    Financial Resource Strain: 1  Food Insecurity: No Food Insecurity (05/04/2023)   Hunger Vital Sign    Worried About Running Out of Food in the Last Year: Never true    Ran Out of Food in the Last Year: Never true  Transportation Needs: No Transportation Needs (05/04/2023)   PRAPARE - Administrator, Civil Service (Medical): No    Lack of Transportation (Non-Medical): No  Physical Activity: Not on File (02/22/2022)   Received from [facility]   Physical Activity    Physical Activity: 0  Stress: Not on File (02/22/2022)   Received from [facility]   Stress    Stress: 0  Social Connections: Not on File (09/27/2022)   Received from [facility]   Social  Connections    Connectedness: 0   Additional Social History:                         Sleep: Good  Appetite:  Good  Current Medications: Current Facility-Administered Medications  Medication Dose Route Frequency Provider Last Rate Last Admin   acetaminophen  (TYLENOL ) tablet 650 mg  650 mg Oral Q6H PRN Buena Carmine, NP       alum & mag hydroxide-simeth (MAALOX/MYLANTA) 200-200-20 MG/5ML suspension 30 mL  30 mL Oral Q4H PRN Buena Carmine, NP       haloperidol  (HALDOL ) tablet 5 mg  5 mg Oral TID PRN Buena Carmine, NP       And   diphenhydrAMINE  (BENADRYL ) capsule 50 mg  50 mg Oral TID PRN Buena Carmine, NP       haloperidol  lactate (HALDOL ) injection 5 mg  5 mg Intramuscular TID PRN Buena Carmine, NP       And   diphenhydrAMINE  (BENADRYL ) injection 50 mg  50 mg Intramuscular TID PRN Buena Carmine, NP       And   LORazepam  (ATIVAN ) injection 2 mg  2 mg Intramuscular TID PRN Buena Carmine, NP       haloperidol  lactate (HALDOL ) injection 10 mg  10 mg Intramuscular TID PRN Buena Carmine, NP       And   diphenhydrAMINE  (BENADRYL ) injection 50 mg  50 mg Intramuscular TID PRN Buena Carmine, NP       And   LORazepam  (ATIVAN ) injection 2 mg  2 mg Intramuscular TID PRN Buena Carmine, NP       FLUoxetine  (PROZAC ) capsule 40 mg  40 mg Oral QHS Ace Bergfeld, PA-C       hydrOXYzine  (ATARAX ) tablet 25 mg  25 mg Oral TID PRN Buena Carmine, NP   25 mg at 05/08/23 1054   magnesium  hydroxide (MILK OF MAGNESIA) suspension 30 mL  30 mL Oral Daily PRN Buena Carmine, NP       OLANZapine  zydis (ZYPREXA ) disintegrating tablet 5 mg  5 mg Oral QHS Weber, Kyra A, NP   5 mg at 05/07/23 2114   traZODone  (DESYREL ) tablet 50 mg  50 mg Oral QHS PRN Buena Carmine, NP   50 mg at 05/07/23 2114    Lab Results: No results found for this or any previous visit (from the past 48 hours).  Blood Alcohol level:  Lab Results  Component Value  Date   ETH <10 05/03/2023   ETH <10 03/07/2023    Metabolic Disorder Labs: Lab Results  Component Value Date   HGBA1C 4.5 (L) 05/03/2023   MPG 82.45 05/03/2023   No results found for: "PROLACTIN" Lab Results  Component Value Date   CHOL 119 05/03/2023   TRIG 72 05/03/2023   HDL 51 05/03/2023   CHOLHDL 2.3 05/03/2023   VLDL 14 05/03/2023   LDLCALC 54 05/03/2023    Physical Findings: AIMS:  , ,  ,  ,    CIWA:    COWS:     Musculoskeletal: Strength & Muscle Tone: within normal limits Gait & Station: normal Patient leans: N/A  Psychiatric Specialty Exam:  Presentation  General Appearance:  Appropriate for Environment  Eye Contact: Fair  Speech: Clear and Coherent  Speech Volume: Normal  Handedness: Right   Mood and Affect  Mood: Depressed  Affect: Depressed   Thought Process  Thought Processes: Coherent  Descriptions of Associations:Circumstantial  Orientation:Full (Time, Place and Person)  Thought Content:WDL  History of Schizophrenia/Schizoaffective disorder:No  Duration of Psychotic Symptoms:No data recorded Hallucinations:No data recorded  Ideas of Reference:None  Suicidal Thoughts:No data recorded  Homicidal Thoughts:No data recorded   Sensorium  Memory: Immediate Fair; Recent Fair; Remote Fair  Judgment: Fair  Insight: Fair   Art therapist  Concentration: Fair  Attention Span: Fair  Recall: [employer] of Knowledge: Fair  Language: Fair   Psychomotor Activity  Psychomotor Activity: No data recorded   Assets  Assets: Communication Skills; Desire for Improvement; Social Support; Physical Health   Sleep  Sleep: No data  recorded    Physical Exam: Physical Exam Vitals and nursing note reviewed.  Constitutional:      Appearance: Normal appearance.  Eyes:     Pupils: Pupils are equal, round, and reactive to light.  Pulmonary:     Effort: Pulmonary effort is normal.  Skin:    General:  Skin is dry.  Neurological:     Mental Status: He is alert and oriented to person, place, and time.  Psychiatric:        Attention and Perception: Attention and perception normal.        Mood and Affect: Mood normal. Affect is flat.        Speech: Speech normal.        Behavior: Behavior is withdrawn. Behavior is cooperative.        Thought Content: Thought content normal.        Cognition and Memory: Cognition and memory normal.     Comments: Judgement is fair     Review of Systems  All other systems reviewed and are negative.  Blood pressure 97/60, pulse (!) 59, temperature (!) 97 F (36.1 C), resp. rate 16, height 5\' 9"  (1.753 m), weight 56.2 kg, SpO2 99%. Body mass index is 18.31 kg/m.   Treatment Plan Summary: Daily contact with patient to assess and evaluate symptoms and progress in treatment and Medication management Pt is denying any psychiatric sxs though he does appear to be depressed, withdrawn to his assigned room, fleeting eye contact. Have discussed with his mother importance of family meeting to determine baseline. She will be visiting this evening.    Major depressive disorder   Increase prozac  20mg  PO Q HS Continue zyprexa  5mg  PO Q HS Continue hydroxyzine  25 mg p.o. 3 times daily as needed   CSW to continue working on discharge disposition Patient encouraged to participate in therapeutic milieu   Dearl Rudden, PA-C 05/08/2023, 4:32 PM

## 2023-05-08 NOTE — Group Note (Signed)
 Date:  05/08/2023 Time:  4:47 PM  Group Topic/Focus:  Building Self Esteem:   The Focus of this group is helping patients become aware of the effects of self-esteem on their lives, the things they and others do that enhance or undermine their self-esteem, seeing the relationship between their level of self-esteem and the choices they make and learning ways to enhance self-esteem.    Participation Level:  Did Not Attend   [name] [name] 05/08/2023, 4:47 PM

## 2023-05-09 DIAGNOSIS — F339 Major depressive disorder, recurrent, unspecified: Secondary | ICD-10-CM | POA: Diagnosis not present

## 2023-05-09 MED ORDER — OLANZAPINE 5 MG PO TBDP: 5.0000 mg | ORAL_TABLET | Freq: Every day | ORAL | 0 refills | Status: AC

## 2023-05-09 MED ORDER — TRAZODONE HCL 50 MG PO TABS: 50.0000 mg | ORAL_TABLET | Freq: Every evening | ORAL | 0 refills | Status: AC | PRN

## 2023-05-09 MED ORDER — HYDROXYZINE HCL 25 MG PO TABS: 25.0000 mg | ORAL_TABLET | Freq: Three times a day (TID) | ORAL | 0 refills | Status: AC | PRN

## 2023-05-09 MED ORDER — FLUOXETINE HCL 40 MG PO CAPS: 40.0000 mg | ORAL_CAPSULE | Freq: Every day | ORAL | 0 refills | Status: AC

## 2023-05-09 NOTE — Plan of Care (Signed)
  Problem: Education: Goal: Knowledge of [facility] Education information/materials will improve Outcome: Progressing Goal: Emotional status will improve Outcome: Not Progressing Goal: Mental status will improve Outcome: Progressing

## 2023-05-09 NOTE — Group Note (Signed)
 Date:  05/09/2023 Time:  9:54 AM  Group Topic/Focus:  Goals Group:   The focus of this group is to help patients establish daily goals to achieve during treatment and discuss how the patient can incorporate goal setting into their daily lives to aide in recovery.    Participation Level:  Did Not Attend   Mellie Sprinkle [facility] 05/09/2023, 9:54 AM

## 2023-05-09 NOTE — Discharge Summary (Signed)
 Physician Discharge Summary Note  Patient:  [name] is an 18 y.o., adult MRN:  [id number] DOB:  [birth year] Patient phone:  [phone] (home)  Patient address:   [address] [place] [address],  Total Time spent with patient: 1 hour  Date of Admission:  05/04/2023 Date of Discharge: 05/09/2023  Reason for Admission:  [name] is  18 year-old male patient who  presented to [facility]  on 05/03/2023 for a psychiatric evaluation.  He presented  with history of depression with suicidal thoughts and was  accompanied by [facility] and counselor from [facility]. Patient was attending a weekly counseling session and revealed to his therapist that he had been taking extra doses of his hydroxyzine  to facilitate sleep when he was severely depressed.  He reported that  he was taking his fluoxetine  1 tablet/day as normal.  He has not established with a medication provider and has been taking doses of medication that he was able to get a refill from the emergency department on 03/07/2023.  Patient was admitted  to the [facility]  crisis center on 02/28/2023. Due to suicidal ideations with a plan of jumping off of a bridge.  During his admission he was started on fluoxetine  and hydroxyzine  and subsequently discharged with these medications.  Patient was instructed to follow-up here at the [facility] outpatient clinic but reported that he never was able to follow-up.   Principal Problem: MDD (major depressive disorder), recurrent episode, with atypical features [facility]) Discharge Diagnoses: Principal Problem:   MDD (major depressive disorder), recurrent episode, with atypical features (HCC)   Past Psychiatric History:  see H&P  Past Medical History:  Past Medical History:  Diagnosis Date   Bevin Bucks syndrome, left eye    Congenital macrocephaly    mild   Development delay    Environmental allergies    Hearing loss    congenital --  no aids   History of acute respiratory failure 12/2004   in setting RSV and  reactive airway disease//  respiratory failure as newborn born at 30 wks   History of pneumonia 03/02/2005   w/ right otitis media and thrush   Hypertropia of right eye    Immunizations up to date    Ocular torticollis    both eyes   Third nerve palsy     Past Surgical History:  Procedure Laterality Date   MRI  12/18/2006   SEDATION   MUSCLE RECESSION AND RESECTION Bilateral 09/16/2015   Procedure: LEFT SUPERIOR OBLIQUE TENDON EXPANDER ,  LEFT INFERIOR OBLIQUE RESECTION/ ADVANCEMENT, RIGHT INFERIOR OBLIQUE RECESSION;  Surgeon: Lorena Rolling, MD;  Location: [facility];  Service: Ophthalmology;  Laterality: Bilateral;   NASOLACRIMAL DUCT PROBING Bilateral 07/27/2007   and Left superior oblique nasal tenectomy/  Left inferior & lateral rectus muscule   REMOVAL LEFT EAR CYST  age 40 or 4   SUPERIOR OBLIQUE TENOTOMY Right 01/02/2009   REPEAT FOR RESIDUAL BROWN SYNDROME   TRANSTHORACIC ECHOCARDIOGRAM  04/07/2004   normal cardiac structure and function w/ no evidence aortic stenosis (mean transaortic vavle grandient 2.69mmHg),  ef 55-60%   Family History:  Family History  Problem Relation Age of Onset   Cancer Other    Family Psychiatric  History: see H&P Social History:  Social History   Substance and Sexual Activity  Alcohol Use Never     Social History   Substance and Sexual Activity  Drug Use Never    Social History   Socioeconomic History   Marital status:  Single    Spouse name: Not on file   Number of children: Not on file   Years of education: Not on file   Highest education level: Not on file  Occupational History   Not on file  Tobacco Use   Smoking status: Never   Smokeless tobacco: Never  Vaping Use   Vaping status: Never Used  Substance and Sexual Activity   Alcohol use: Never   Drug use: Never   Sexual activity: Never  Other Topics Concern   Not on file  Social History Narrative   BORN AT 76 WKS  In NICU FOR 1 MONTH FOR RESPIRATORY/  GI FAILURE.        NO PT/  FAMILY ANESTHESIA PROBLEMS.      NO SMOKER IN HOME.      LIVES W/ MOTHER.  NO CUSTODY ISSUES.      6 TH GRADE AT [employer]    Social Drivers of Health   Financial Resource Strain: Not at Risk (03/07/2022)   Received from [employer]    Financial Resource Strain: 1  Food Insecurity: No Food Insecurity (05/04/2023)   Hunger Vital Sign    Worried About Running Out of Food in the Last Year: Never true    Ran Out of Food in the Last Year: Never true  Transportation Needs: No Transportation Needs (05/04/2023)   PRAPARE - Administrator, Civil Service (Medical): No    Lack of Transportation (Non-Medical): No  Physical Activity: Not on File (02/22/2022)   Received from [facility]   Physical Activity    Physical Activity: 0  Stress: Not on File (02/22/2022)   Received from [facility]   Stress    Stress: 0  Social Connections: Not on File (09/27/2022)   Received from [employer]   Social Connections    Connectedness: 0    Hospital Course:   During the course of hospitalization, pt received daily multiple modalities of treatments consisting of Psychopharmacology, individual, group, psychoeducational, recreational, milieu therapy, including case management to coordinate pts inpatient and outpatient care and in concert with weekly treatment team meetings. Discharge planning was initiated on the day of admission to ensure a safe discharge. The presenting symptoms were closely monitored and medications were started as indicated. There were no complications. The principal reasons for hospitalization consisted of MDD, SI  Medications addressing the principal problem were initiated with improvement in severity sufficient to discharge to a lower level of care. Patient was started on Zyprexa  5mg  HS for depressive adjunct, home medication Prozaac was increased to 20 mg daily for MDD at admission, was uptitratred to 40 mg daily prior to  discharge.  Patient was also ordered as needed medications hydroxyzine  25 3 times daily for anxiety, trazodone  50 mg at bedtime for sleep.  It is intended for the outpatient provider to determine whether to continue these medications, or if these medication needs to be titrated for continued outpatient therapy.  All identified psychiatric, general medical/surgical psychosocial obstacles to discharge were addressed. Patient tolerated these medications with no noted side effects. All these medications were titrated to discharge levels (Please see discharge medications below). Patient showed slow but steady and sustained symptomatic improvement before discharge. The patient denied suicidal, homicidal ideations and hallucinations. Family session held to determine baseline behaviors and for safe discharge plan.  Prior to discharge today, patient's mother were contacted to assess baseline.  Reports that she has been yesterday, appears to be back  to baseline, has no concerns for the patient to be discharged today. On the day of discharge 05/09/2023, following sustained improvement in the affect of this patient, continued report of repeated denial of suicidal, homicidal and other violent ideations, adequate interaction with peers, active participation in groups while on the unit, and denial of adverse reactions from the medications, the treatment team decided that  [name] was stable for discharge back  home with scheduled mental health treatment as below.  At baseline patient continues with flat affect, endorses anxiety, withdrawn to his room.  A comprehensive risk assessment was done prior to discharge and shows that patient is at low risk for suicide or violence and will continue to be if patient complies with the treatment recommendations, medications and therapy.  At the time of discharge, patient no longer meeting criteria for IVC, patient is not an imminent danger to self or others. patient agrees to call  Crisis Services, 911 and/or return to the ED if safety cannot be maintained outside the hospital setting. Discharge medications reviewed with patient, explanation of indication, risks/benefits and side effects profiles. The patient verbalized understanding and is in agreement with the discharge plan.  Physical Findings: AIMS:  , ,  ,  ,    CIWA:    COWS:     Musculoskeletal: Strength & Muscle Tone: within normal limits Gait & Station: normal Patient leans: N/A   Psychiatric Specialty Exam:  Presentation  General Appearance:  Appropriate for Environment  Eye Contact: Fleeting  Speech: Clear and Coherent  Speech Volume: Normal  Handedness: Right   Mood and Affect  Mood: Anxious  Affect: Restricted   Thought Process  Thought Processes: Coherent; Goal Directed; Linear  Descriptions of Associations:Intact  Orientation:Full (Time, Place and Person)  Thought Content:WDL  History of Schizophrenia/Schizoaffective disorder:No  Duration of Psychotic Symptoms:No data recorded Hallucinations:Hallucinations: None  Ideas of Reference:None  Suicidal Thoughts:Suicidal Thoughts: No  Homicidal Thoughts:Homicidal Thoughts: No   Sensorium  Memory: Immediate Good  Judgment: Fair  Insight: Fair   Executive Functions  Concentration: Good  Attention Span: Good  Recall: Fair  Fund of Knowledge: Fair  Language: Fair   Psychomotor Activity  Psychomotor Activity: Psychomotor Activity: Normal   Assets  Assets: Communication Skills; Desire for Improvement; Housing; Physical Health; Social Support; Vocational/Educational   Sleep  Sleep: Sleep: Good    Physical Exam: Physical Exam Vitals and nursing note reviewed.  Eyes:     Extraocular Movements: Extraocular movements intact.  Pulmonary:     Effort: Pulmonary effort is normal.  Skin:    General: Skin is dry.  Neurological:     Mental Status: He is alert and oriented to person, place,  and time. Mental status is at baseline.  Psychiatric:        Attention and Perception: Attention and perception normal.        Mood and Affect: Mood is anxious. Affect is flat.        Speech: Speech normal.        Behavior: Behavior normal. Behavior is cooperative.        Thought Content: Thought content normal.        Cognition and Memory: Cognition and memory normal.    Review of Systems  Psychiatric/Behavioral:  The patient is nervous/anxious.   All other systems reviewed and are negative.  Blood pressure (!) 97/52, pulse 64, temperature 97.8 F (36.6 C), resp. rate 16, height 5\' 9"  (1.753 m), weight 56.2 kg, SpO2 99%. Body mass index is  18.31 kg/m.   Social History   Tobacco Use  Smoking Status Never  Smokeless Tobacco Never   Tobacco Cessation:  N/A, patient does not currently use tobacco products   Blood Alcohol level:  Lab Results  Component Value Date   ETH <10 05/03/2023   ETH <10 03/07/2023    Metabolic Disorder Labs:  Lab Results  Component Value Date   HGBA1C 4.5 (L) 05/03/2023   MPG 82.45 05/03/2023   No results found for: "PROLACTIN" Lab Results  Component Value Date   CHOL 119 05/03/2023   TRIG 72 05/03/2023   HDL 51 05/03/2023   CHOLHDL 2.3 05/03/2023   VLDL 14 05/03/2023   LDLCALC 54 05/03/2023    See Psychiatric Specialty Exam and Suicide Risk Assessment completed by Attending Physician prior to discharge.  Discharge destination:  Home  Is patient on multiple antipsychotic therapies at discharge:  No   Has Patient had three or more failed trials of antipsychotic monotherapy by history:  No  Recommended Plan for Multiple Antipsychotic Therapies: NA   Allergies as of 05/09/2023   No Known Allergies      Medication List     TAKE these medications      Indication  FLUoxetine  40 MG capsule Commonly known as: PROZAC  Take 1 capsule (40 mg total) by mouth at bedtime. What changed:  medication strength how much to take when to  take this  Indication: Major Depressive Disorder   hydrOXYzine  25 MG tablet Commonly known as: ATARAX  Take 1 tablet (25 mg total) by mouth 3 (three) times daily as needed for anxiety. What changed:  when to take this reasons to take this  Indication: Feeling Anxious   OLANZapine  zydis 5 MG disintegrating tablet Commonly known as: ZYPREXA  Take 1 tablet (5 mg total) by mouth at bedtime.  Indication: Major Depressive Disorder   traZODone  50 MG tablet Commonly known as: DESYREL  Take 1 tablet (50 mg total) by mouth at bedtime as needed for sleep.  Indication: Anxiety Disorder, Trouble Sleeping, Major Depressive Disorder         Follow-up recommendations:   # It is recommended to the patient to continue psychiatric medications as prescribed, after discharge from the hospital.   # It is recommended to the patient to follow up with your outpatient psychiatric provider and PCP. # It was discussed with the patient, the impact of alcohol, drugs, tobacco have been there overall psychiatric and medical wellbeing, and total abstinence from substance use was recommended. # Prescriptions provided or sent directly to preferred pharmacy at discharge. Patient agreeable to plan. Given the opportunity to ask questions. Appears to feel comfortable with discharge.  # In the event of worsening symptoms, the patient is instructed to call the crisis hotline (988), 911 and or go to the nearest ED for appropriate evaluation and treatment of symptoms. To follow-up with primary care provider for other medical issues, concerns and or health care needs # Patient was discharged home with his mother with a plan to follow up as noted above.    SignedSheilda Deputy, PA-C 05/09/2023, 12:56 PM

## 2023-05-09 NOTE — Progress Notes (Signed)
 Discharge Note:  Patient denies SI/HI/AVH at this time. Discharge instructions, AVS, prescriptions, and transition record reviewed with patient. Patient agrees to comply with medication management, follow-up visit, and outpatient therapy. Patient belongings returned to patient. Patient questions and concerns addressed and answered. Patient ambulatory off unit @ 1545, patient discharged to home with his mother.Aaron Aas

## 2023-05-09 NOTE — Group Note (Signed)
 Recreation Therapy Group Note   Group Topic:Goal Setting  Group Date: 05/09/2023 Start Time: 1000 End Time: 1100 Facilitators: Deatrice Factor, LRT, CTRS Location:  Craft Room  Group Description: Product/process development scientist. Patients were given many different magazines, a glue stick, markers, and a piece of cardstock paper. LRT and pts discussed the importance of having goals in life. LRT and pts discussed the difference between short-term and long-term goals, as well as what a SMART goal is. LRT encouraged pts to create a vision board, with images they picked and then cut out with safety scissors from the magazine, for themselves, that capture their short and long-term goals. LRT encouraged pts to show and explain their vision board to the group.   Goal Area(s) Addressed:  Patient will gain knowledge of short vs. long term goals.  Patient will identify goals for themselves. Patient will practice setting SMART goals. Patient will verbalize their goals to LRT and peers.   Affect/Mood: N/A   Participation Level: Did not attend    Clinical Observations/Individualized Feedback: Patient did not attend group.   Plan: Continue to engage patient in RT group sessions 2-3x/week.   [id number], LRT, CTRS 05/09/2023 12:11 PM

## 2023-05-09 NOTE — BHH Suicide Risk Assessment (Signed)
 Suicide Risk Assessment  Discharge Assessment    [facility] Discharge Suicide Risk Assessment   Principal Problem: MDD (major depressive disorder), recurrent episode, with atypical features [facility]) Discharge Diagnoses: Principal Problem:   MDD (major depressive disorder), recurrent episode, with atypical features (HCC)   Total Time spent with patient: 1 hour  Musculoskeletal: Strength & Muscle Tone: within normal limits Gait & Station: normal Patient leans: N/A  Psychiatric Specialty Exam  Presentation  General Appearance:  Appropriate for Environment  Eye Contact: Fleeting  Speech: Clear and Coherent  Speech Volume: Normal  Handedness: Right   Mood and Affect  Mood: Anxious  Duration of Depression Symptoms: Greater than two weeks  Affect: Restricted   Thought Process  Thought Processes: Coherent; Goal Directed; Linear  Descriptions of Associations:Intact  Orientation:Full (Time, Place and Person)  Thought Content:WDL  History of Schizophrenia/Schizoaffective disorder:No  Duration of Psychotic Symptoms:No data recorded Hallucinations:Hallucinations: None  Ideas of Reference:None  Suicidal Thoughts:Suicidal Thoughts: No  Homicidal Thoughts:Homicidal Thoughts: No   Sensorium  Memory: Immediate Good  Judgment: Fair  Insight: Fair   Executive Functions  Concentration: Good  Attention Span: Good  Recall: Fair  Fund of Knowledge: Fair  Language: Fair   Psychomotor Activity  Psychomotor Activity: Psychomotor Activity: Normal   Assets  Assets: Communication Skills; Desire for Improvement; Housing; Physical Health; Social Support; Vocational/Educational   Sleep  Sleep: Sleep: Good   Physical Exam: Physical Exam Vitals and nursing note reviewed.  Eyes:     Extraocular Movements: Extraocular movements intact.  Skin:    General: Skin is dry.  Neurological:     Mental Status: He is alert and oriented to person, place,  and time.  Psychiatric:        Attention and Perception: Attention and perception normal.        Mood and Affect: Mood is anxious. Affect is flat.        Speech: Speech normal.        Behavior: Behavior is withdrawn. Behavior is cooperative.        Thought Content: Thought content normal.        Cognition and Memory: Cognition and memory normal.    Review of Systems  Psychiatric/Behavioral:  The patient is nervous/anxious.   All other systems reviewed and are negative.  Blood pressure (!) 97/52, pulse 64, temperature 97.8 F (36.6 C), resp. rate 16, height 5\' 9"  (1.753 m), weight 56.2 kg, SpO2 99%. Body mass index is 18.31 kg/m.  Mental Status Per Nursing Assessment::   On Admission:  Self-harm behaviors  Demographic Factors:  Male, Adolescent or young adult, and Unemployed  Loss Factors: NA  Historical Factors: Prior suicide attempts  Risk Reduction Factors:   Living with another person, especially a relative and Positive social support  Continued Clinical Symptoms:  Previous Psychiatric Diagnoses and Treatments  Cognitive Features That Contribute To Risk:  None    Suicide Risk:  Minimal: No identifiable suicidal ideation.  Patients presenting with no risk factors but with morbid ruminations; may be classified as minimal risk based on the severity of the depressive symptoms    Plan Of Care/Follow-up recommendations:  # It is recommended to the patient to continue psychiatric medications as prescribed, after discharge from the hospital.   # It is recommended to the patient to follow up with your outpatient psychiatric provider and PCP. # It was discussed with the patient, the impact of alcohol, drugs, tobacco have been there overall psychiatric and medical wellbeing, and total abstinence from substance  use was recommended. # Prescriptions provided or sent directly to preferred pharmacy at discharge. Patient agreeable to plan. Given the opportunity to ask questions.  Appears to feel comfortable with discharge.  # In the event of worsening symptoms, the patient is instructed to call the crisis hotline (988), 911 and or go to the nearest ED for appropriate evaluation and treatment of symptoms. To follow-up with primary care provider for other medical issues, concerns and or health care needs # Patient was discharged home with his mother with a plan to follow up as noted above.    Jennylee Uehara, PA-C 05/09/2023, 12:56 PM

## 2023-05-09 NOTE — BHH Counselor (Signed)
 CSW met with pt briefly to discuss discharge/aftercare plans. He reported plans to return home upon discharge and that his mother would be providing transportation. Aftercare appointment scheduling was discussed briefly. He is in agreement for continued follow up for medication management and therapy as needed. Pt has clothes to wear home upon discharge. He denied any use of tobacco products or substance use, or need for cessation services. No other concerns expressed. Contact ended without incident.   Elvie Hammed. Johnnye Nancy, MSW, LCSW, LCAS 05/09/2023 2:20 PM

## 2023-05-09 NOTE — Plan of Care (Signed)
  Problem: Activity: Goal: Interest or engagement in activities will improve Outcome: Adequate for Discharge Goal: Sleeping patterns will improve Outcome: Adequate for Discharge   Problem: Activity: Goal: Sleeping patterns will improve Outcome: Adequate for Discharge   Problem: Coping: Goal: Ability to verbalize frustrations and anger appropriately will improve Outcome: Adequate for Discharge Goal: Ability to demonstrate self-control will improve Outcome: Adequate for Discharge   Problem: Safety: Goal: Periods of time without injury will increase Outcome: Adequate for Discharge

## 2023-05-09 NOTE — Progress Notes (Signed)
   05/08/23 2000  Psych Admission Type (Psych Patients Only)  Admission Status Voluntary  Psychosocial Assessment  Patient Complaints None  Eye Contact Fair  Facial Expression Flat  Affect Depressed  Speech Logical/coherent  Interaction Minimal  Motor Activity Slow  Appearance/Hygiene Unremarkable  Behavior Characteristics Appropriate to situation;Cooperative  Mood Pleasant  Aggressive Behavior  Effect No apparent injury  Thought Process  Coherency WDL  Content WDL  Delusions None reported or observed  Perception WDL  Hallucination None reported or observed  Judgment WDL  Confusion WDL  Danger to Self  Current suicidal ideation? Denies  Self-Injurious Behavior No self-injurious ideation or behavior indicators observed or expressed   Agreement Not to Harm Self Yes  Description of Agreement verbal  Danger to Others  Danger to Others None reported or observed   Isolative to self and room. Minimal interaction with staff. No interaction with peers. Most of shift in room. Did come out for meds. Encouragement and support provided. Safety checks maintained. Meds given as presribed. Pt receptive and remains safe on unit with q 15 min checks.

## 2023-05-09 NOTE — Progress Notes (Signed)
  [facility] Adult Case Management Discharge Plan :  Will you be returning to the same living situation after discharge:  Yes,  pt plans to return home upon discharge. At discharge, do you have transportation home?: Yes,  pt mother to provide transportation. Do you have the ability to pay for your medications: Yes,  [place] MEDICAID [employer]PAID [employer] PLAN / [place] MEDICAID [facility]  Release of information consent forms completed and in the chart;  Patient's signature needed at discharge.  Patient to Follow up at:  Follow-up Information     [facility] Follow up.   Why: Your appointment is scheduled for Tuesday, 05/16/23 at 11 AM. This is a virtual appointment and they will be contacting you at [phone]. Thanks! Contact information: [address]  [address] [place] [address] [phone]                 Next level of care provider has access to [facility] Link:no  Safety Planning and Suicide Prevention discussed: Yes,  SPE completed with pt mother, Hever Castilleja.      Has patient been referred to the Quitline?: Patient does not use tobacco/nicotine products  Patient has been referred for addiction treatment: No known substance use disorder.  Randolm Butte, LCSW 05/09/2023, 2:34 PM
